# Patient Record
Sex: Female | Born: 1945
Health system: Southern US, Community
[De-identification: ages and names within clinical notes are randomized; demographics above are authoritative.]

## PROBLEM LIST (undated history)

## (undated) DIAGNOSIS — Z8619 Personal history of other infectious and parasitic diseases: Secondary | ICD-10-CM

## (undated) DIAGNOSIS — R430 Anosmia: Secondary | ICD-10-CM

## (undated) DIAGNOSIS — M810 Age-related osteoporosis without current pathological fracture: Secondary | ICD-10-CM

## (undated) DIAGNOSIS — M419 Scoliosis, unspecified: Secondary | ICD-10-CM

## (undated) DIAGNOSIS — N939 Abnormal uterine and vaginal bleeding, unspecified: Secondary | ICD-10-CM

## (undated) DIAGNOSIS — R432 Parageusia: Secondary | ICD-10-CM

## (undated) HISTORY — DX: Parageusia: R43.2

## (undated) HISTORY — DX: Personal history of other infectious and parasitic diseases: Z86.19

## (undated) HISTORY — DX: Scoliosis, unspecified: M41.9

## (undated) HISTORY — DX: Anosmia: R43.0

## (undated) HISTORY — DX: Abnormal uterine and vaginal bleeding, unspecified: N93.9

## (undated) HISTORY — DX: Age-related osteoporosis without current pathological fracture: M81.0

---

## 1996-07-12 DIAGNOSIS — N939 Abnormal uterine and vaginal bleeding, unspecified: Secondary | ICD-10-CM

## 1996-07-12 HISTORY — DX: Abnormal uterine and vaginal bleeding, unspecified: N93.9

## 1999-10-08 ENCOUNTER — Other Ambulatory Visit: Admission: RE | Admit: 1999-10-08 | Discharge: 1999-10-08 | Payer: Self-pay | Admitting: *Deleted

## 2000-11-01 ENCOUNTER — Other Ambulatory Visit: Admission: RE | Admit: 2000-11-01 | Discharge: 2000-11-01 | Payer: Self-pay | Admitting: Obstetrics and Gynecology

## 2003-04-09 ENCOUNTER — Other Ambulatory Visit: Admission: RE | Admit: 2003-04-09 | Discharge: 2003-04-09 | Payer: Self-pay | Admitting: Obstetrics and Gynecology

## 2004-05-12 ENCOUNTER — Other Ambulatory Visit: Admission: RE | Admit: 2004-05-12 | Discharge: 2004-05-12 | Payer: Self-pay | Admitting: Obstetrics and Gynecology

## 2005-12-10 ENCOUNTER — Other Ambulatory Visit: Admission: RE | Admit: 2005-12-10 | Discharge: 2005-12-10 | Payer: Self-pay | Admitting: Obstetrics and Gynecology

## 2007-03-15 ENCOUNTER — Other Ambulatory Visit: Admission: RE | Admit: 2007-03-15 | Discharge: 2007-03-15 | Payer: Self-pay | Admitting: Obstetrics and Gynecology

## 2008-03-01 ENCOUNTER — Other Ambulatory Visit: Admission: RE | Admit: 2008-03-01 | Discharge: 2008-03-01 | Payer: Self-pay | Admitting: Obstetrics and Gynecology

## 2010-05-07 LAB — HM DEXA SCAN

## 2010-11-03 ENCOUNTER — Other Ambulatory Visit: Payer: Self-pay | Admitting: Allergy

## 2010-11-03 ENCOUNTER — Ambulatory Visit
Admission: RE | Admit: 2010-11-03 | Discharge: 2010-11-03 | Disposition: A | Discharge status: Home / Self Care | Payer: BC Managed Care – PPO | Source: Ambulatory Visit | Attending: Allergy | Admitting: Allergy

## 2011-01-11 ENCOUNTER — Other Ambulatory Visit: Payer: Self-pay | Admitting: Otolaryngology

## 2011-01-11 DIAGNOSIS — R439 Unspecified disturbances of smell and taste: Secondary | ICD-10-CM

## 2011-01-12 ENCOUNTER — Ambulatory Visit
Admission: RE | Admit: 2011-01-12 | Discharge: 2011-01-12 | Disposition: A | Discharge status: Home / Self Care | Payer: BC Managed Care – PPO | Source: Ambulatory Visit | Attending: Otolaryngology | Admitting: Otolaryngology

## 2011-01-12 DIAGNOSIS — R439 Unspecified disturbances of smell and taste: Secondary | ICD-10-CM

## 2011-01-12 MED ORDER — GADOBENATE DIMEGLUMINE 529 MG/ML IV SOLN
11.0000 mL | Freq: Once | INTRAVENOUS | Status: AC | PRN
  Administered 2011-01-12: 11 mL via INTRAVENOUS

## 2011-07-13 HISTORY — PX: NASAL SINUS SURGERY: SHX719

## 2012-04-12 DIAGNOSIS — R030 Elevated blood-pressure reading, without diagnosis of hypertension: Secondary | ICD-10-CM | POA: Diagnosis not present

## 2012-04-12 DIAGNOSIS — R82998 Other abnormal findings in urine: Secondary | ICD-10-CM | POA: Diagnosis not present

## 2012-04-12 DIAGNOSIS — M4120 Other idiopathic scoliosis, site unspecified: Secondary | ICD-10-CM | POA: Diagnosis not present

## 2012-04-19 DIAGNOSIS — Z0000 Encounter for general adult medical examination without abnormal findings: Secondary | ICD-10-CM | POA: Diagnosis not present

## 2012-04-19 DIAGNOSIS — J45909 Unspecified asthma, uncomplicated: Secondary | ICD-10-CM | POA: Diagnosis not present

## 2012-04-19 DIAGNOSIS — R030 Elevated blood-pressure reading, without diagnosis of hypertension: Secondary | ICD-10-CM | POA: Diagnosis not present

## 2012-04-19 DIAGNOSIS — M4120 Other idiopathic scoliosis, site unspecified: Secondary | ICD-10-CM | POA: Diagnosis not present

## 2012-04-20 DIAGNOSIS — Z1212 Encounter for screening for malignant neoplasm of rectum: Secondary | ICD-10-CM | POA: Diagnosis not present

## 2012-04-24 DIAGNOSIS — H4011X0 Primary open-angle glaucoma, stage unspecified: Secondary | ICD-10-CM | POA: Diagnosis not present

## 2012-04-24 DIAGNOSIS — H409 Unspecified glaucoma: Secondary | ICD-10-CM | POA: Diagnosis not present

## 2012-07-10 DIAGNOSIS — J322 Chronic ethmoidal sinusitis: Secondary | ICD-10-CM | POA: Diagnosis not present

## 2012-07-10 DIAGNOSIS — J320 Chronic maxillary sinusitis: Secondary | ICD-10-CM | POA: Diagnosis not present

## 2012-07-10 DIAGNOSIS — J321 Chronic frontal sinusitis: Secondary | ICD-10-CM | POA: Diagnosis not present

## 2012-09-08 DIAGNOSIS — H1130 Conjunctival hemorrhage, unspecified eye: Secondary | ICD-10-CM | POA: Diagnosis not present

## 2012-09-21 DIAGNOSIS — Z1231 Encounter for screening mammogram for malignant neoplasm of breast: Secondary | ICD-10-CM | POA: Diagnosis not present

## 2012-10-26 DIAGNOSIS — H409 Unspecified glaucoma: Secondary | ICD-10-CM | POA: Diagnosis not present

## 2012-10-26 DIAGNOSIS — H4011X0 Primary open-angle glaucoma, stage unspecified: Secondary | ICD-10-CM | POA: Diagnosis not present

## 2012-10-30 DIAGNOSIS — H43819 Vitreous degeneration, unspecified eye: Secondary | ICD-10-CM | POA: Diagnosis not present

## 2012-12-12 DIAGNOSIS — J320 Chronic maxillary sinusitis: Secondary | ICD-10-CM | POA: Diagnosis not present

## 2012-12-12 DIAGNOSIS — J322 Chronic ethmoidal sinusitis: Secondary | ICD-10-CM | POA: Diagnosis not present

## 2012-12-12 DIAGNOSIS — J321 Chronic frontal sinusitis: Secondary | ICD-10-CM | POA: Diagnosis not present

## 2012-12-14 DIAGNOSIS — H4011X0 Primary open-angle glaucoma, stage unspecified: Secondary | ICD-10-CM | POA: Diagnosis not present

## 2012-12-14 DIAGNOSIS — H409 Unspecified glaucoma: Secondary | ICD-10-CM | POA: Diagnosis not present

## 2012-12-14 DIAGNOSIS — H43819 Vitreous degeneration, unspecified eye: Secondary | ICD-10-CM | POA: Diagnosis not present

## 2013-02-01 DIAGNOSIS — J45909 Unspecified asthma, uncomplicated: Secondary | ICD-10-CM | POA: Diagnosis not present

## 2013-02-01 DIAGNOSIS — K219 Gastro-esophageal reflux disease without esophagitis: Secondary | ICD-10-CM | POA: Diagnosis not present

## 2013-04-09 ENCOUNTER — Encounter: Payer: Self-pay | Admitting: Nurse Practitioner

## 2013-04-09 ENCOUNTER — Ambulatory Visit (INDEPENDENT_AMBULATORY_CARE_PROVIDER_SITE_OTHER): Payer: Medicare Other | Admitting: Nurse Practitioner

## 2013-04-09 VITALS — BP 132/76 | HR 84 | Resp 16 | Ht 61.5 in | Wt 133.0 lb

## 2013-04-09 DIAGNOSIS — Z01419 Encounter for gynecological examination (general) (routine) without abnormal findings: Secondary | ICD-10-CM | POA: Diagnosis not present

## 2013-04-09 NOTE — Patient Instructions (Addendum)

## 2013-04-09 NOTE — Progress Notes (Signed)
Patient ID: Kerry White, female   DOB: Aug 26, 1945, 67 y.o.   MRN: [id number] 67 y.o. G36P1001 Married Caucasian Fe here for annual exam.  She has no current health concerns.  She gets her mammogram done in [place]. and will have results sent here.  She has vaginal dryness and occassional hot flashes.  No LMP recorded. Patient is postmenopausal.          Sexually active: yes  The current method of family planning is post menopausal status.    Exercising: yes  Home exercise routine includes walking. Smoker:  no  Health Maintenance: Pap:  03/11/10, WNL MMG:  08/26/11, BI-Rads 2: benign ( done 09/2012 will send a copy) Colonoscopy:  never BMD:   05/07/10, osteoporosis TDaP:  > 10 years Shingles: not done Labs: declined, PCP apt next week   reports that she has never smoked. She has never used smokeless tobacco. She reports that  drinks alcohol. She reports that she does not use illicit drugs.  Past Medical History  Diagnosis Date  . Abnormal uterine bleeding 1998    Past Surgical History  Procedure Laterality Date  . Nasal sinus surgery  2013    Current Outpatient Prescriptions  Medication Sig Dispense Refill  . ALVESCO 80 MCG/ACT inhaler Inhale 1 puff into the lungs 2 (two) times daily.      . BUDESONIDE NA Place into the nose daily.       No current facility-administered medications for this visit.    Family History  Problem Relation Age of Onset  . Alzheimer's disease Mother   . Heart failure Father   . Hyperlipidemia Father   . Heart disease Brother   . Glaucoma Brother     ROS:  Pertinent items are noted in HPI.  Otherwise, a comprehensive ROS was negative.  Exam:   BP 132/76  Pulse 84  Resp 16  Ht 5' 1.5" (1.562 m)  Wt 133 lb (60.328 kg)  BMI 24.73 kg/m2 Height: 5' 1.5" (156.2 cm)  Ht Readings from Last 3 Encounters:  04/09/13 5' 1.5" (1.562 m)    General appearance: alert, cooperative and appears stated age Head: Normocephalic, without obvious  abnormality, atraumatic Neck: no adenopathy, supple, symmetrical, trachea midline and thyroid normal to inspection and palpation Lungs: clear to auscultation bilaterally Breasts: normal appearance, no masses or tenderness Heart: regular rate and rhythm Abdomen: soft, non-tender; no masses,  no organomegaly Extremities: extremities normal, atraumatic, no cyanosis or edema Skin: Skin color, texture, turgor normal. No rashes or lesions Lymph nodes: Cervical, supraclavicular, and axillary nodes normal. No abnormal inguinal nodes palpated Neurologic: Grossly normal Spine - scoliosis is noted   Pelvic: External genitalia:  no lesions              Urethra:  Atrophic and prolapse appearing urethra with no masses, tenderness or lesions              Bartholin's and Skene's: normal                 Vagina: atrophic appearing vagina with pale color and no discharge, no lesions, grade I cystocele              Cervix: anteverted              Pap taken: no Bimanual Exam:  Uterus:  normal size, contour, position, consistency, mobility, non-tender              Adnexa: no mass, fullness, tenderness  Rectovaginal: Confirms               Anus:  normal sphincter tone, no lesions  A:  Well Woman with normal exam  Postmenopausal no HRT  Scoliosis and osteoporosis - followed by PCP    P:   Pap smear as per guidelines Not done today  Mammogram pt will send copy of last one done  Advise extra virgin olive oil of vaginal dryness  She will follow with PCP about update on Immunizations.  Counseled on breast self exam, adequate intake of calcium and vitamin D, diet and exercise return annually or prn  An After Visit Summary was printed and given to the patient.

## 2013-04-10 NOTE — Progress Notes (Signed)
Reviewed personally.  M. Suzanne Pankaj Haack, MD.  

## 2013-04-16 DIAGNOSIS — J321 Chronic frontal sinusitis: Secondary | ICD-10-CM | POA: Diagnosis not present

## 2013-04-16 DIAGNOSIS — J322 Chronic ethmoidal sinusitis: Secondary | ICD-10-CM | POA: Diagnosis not present

## 2013-04-16 DIAGNOSIS — H6120 Impacted cerumen, unspecified ear: Secondary | ICD-10-CM | POA: Diagnosis not present

## 2013-04-16 DIAGNOSIS — Z0000 Encounter for general adult medical examination without abnormal findings: Secondary | ICD-10-CM | POA: Diagnosis not present

## 2013-04-16 DIAGNOSIS — R82998 Other abnormal findings in urine: Secondary | ICD-10-CM | POA: Diagnosis not present

## 2013-04-16 DIAGNOSIS — J320 Chronic maxillary sinusitis: Secondary | ICD-10-CM | POA: Diagnosis not present

## 2013-04-23 DIAGNOSIS — Z6825 Body mass index (BMI) 25.0-25.9, adult: Secondary | ICD-10-CM | POA: Diagnosis not present

## 2013-04-23 DIAGNOSIS — Z23 Encounter for immunization: Secondary | ICD-10-CM | POA: Diagnosis not present

## 2013-04-23 DIAGNOSIS — Z1212 Encounter for screening for malignant neoplasm of rectum: Secondary | ICD-10-CM | POA: Diagnosis not present

## 2013-04-23 DIAGNOSIS — Z0000 Encounter for general adult medical examination without abnormal findings: Secondary | ICD-10-CM | POA: Diagnosis not present

## 2013-04-23 DIAGNOSIS — R030 Elevated blood-pressure reading, without diagnosis of hypertension: Secondary | ICD-10-CM | POA: Diagnosis not present

## 2013-04-23 DIAGNOSIS — J301 Allergic rhinitis due to pollen: Secondary | ICD-10-CM | POA: Diagnosis not present

## 2013-04-23 DIAGNOSIS — J45909 Unspecified asthma, uncomplicated: Secondary | ICD-10-CM | POA: Diagnosis not present

## 2013-04-23 DIAGNOSIS — Z1331 Encounter for screening for depression: Secondary | ICD-10-CM | POA: Diagnosis not present

## 2013-05-02 DIAGNOSIS — H409 Unspecified glaucoma: Secondary | ICD-10-CM | POA: Diagnosis not present

## 2013-05-02 DIAGNOSIS — H4011X0 Primary open-angle glaucoma, stage unspecified: Secondary | ICD-10-CM | POA: Diagnosis not present

## 2013-05-15 DIAGNOSIS — H01009 Unspecified blepharitis unspecified eye, unspecified eyelid: Secondary | ICD-10-CM | POA: Diagnosis not present

## 2013-08-22 DIAGNOSIS — H40019 Open angle with borderline findings, low risk, unspecified eye: Secondary | ICD-10-CM | POA: Diagnosis not present

## 2013-08-22 DIAGNOSIS — H2510 Age-related nuclear cataract, unspecified eye: Secondary | ICD-10-CM | POA: Diagnosis not present

## 2013-08-22 DIAGNOSIS — H43399 Other vitreous opacities, unspecified eye: Secondary | ICD-10-CM | POA: Diagnosis not present

## 2013-08-22 DIAGNOSIS — H01009 Unspecified blepharitis unspecified eye, unspecified eyelid: Secondary | ICD-10-CM | POA: Diagnosis not present

## 2013-10-15 DIAGNOSIS — J322 Chronic ethmoidal sinusitis: Secondary | ICD-10-CM | POA: Diagnosis not present

## 2013-10-15 DIAGNOSIS — T7840XA Allergy, unspecified, initial encounter: Secondary | ICD-10-CM | POA: Diagnosis not present

## 2013-10-15 DIAGNOSIS — J45909 Unspecified asthma, uncomplicated: Secondary | ICD-10-CM | POA: Diagnosis not present

## 2013-11-22 ENCOUNTER — Encounter: Payer: Self-pay | Admitting: Nurse Practitioner

## 2013-12-04 DIAGNOSIS — H01009 Unspecified blepharitis unspecified eye, unspecified eyelid: Secondary | ICD-10-CM | POA: Diagnosis not present

## 2013-12-04 DIAGNOSIS — H40019 Open angle with borderline findings, low risk, unspecified eye: Secondary | ICD-10-CM | POA: Diagnosis not present

## 2013-12-04 DIAGNOSIS — H2510 Age-related nuclear cataract, unspecified eye: Secondary | ICD-10-CM | POA: Diagnosis not present

## 2013-12-04 DIAGNOSIS — H04129 Dry eye syndrome of unspecified lacrimal gland: Secondary | ICD-10-CM | POA: Diagnosis not present

## 2014-04-11 ENCOUNTER — Ambulatory Visit: Payer: Medicare Other | Admitting: Nurse Practitioner

## 2014-04-15 ENCOUNTER — Ambulatory Visit: Payer: Medicare Other | Admitting: Nurse Practitioner

## 2014-04-15 DIAGNOSIS — H01002 Unspecified blepharitis right lower eyelid: Secondary | ICD-10-CM | POA: Diagnosis not present

## 2014-04-15 DIAGNOSIS — H04123 Dry eye syndrome of bilateral lacrimal glands: Secondary | ICD-10-CM | POA: Diagnosis not present

## 2014-04-15 DIAGNOSIS — H2513 Age-related nuclear cataract, bilateral: Secondary | ICD-10-CM | POA: Diagnosis not present

## 2014-04-15 DIAGNOSIS — J322 Chronic ethmoidal sinusitis: Secondary | ICD-10-CM | POA: Diagnosis not present

## 2014-04-15 DIAGNOSIS — H40013 Open angle with borderline findings, low risk, bilateral: Secondary | ICD-10-CM | POA: Diagnosis not present

## 2014-04-15 DIAGNOSIS — H01005 Unspecified blepharitis left lower eyelid: Secondary | ICD-10-CM | POA: Diagnosis not present

## 2014-04-15 DIAGNOSIS — J329 Chronic sinusitis, unspecified: Secondary | ICD-10-CM | POA: Diagnosis not present

## 2014-04-15 DIAGNOSIS — G4489 Other headache syndrome: Secondary | ICD-10-CM | POA: Diagnosis not present

## 2014-04-15 DIAGNOSIS — J4530 Mild persistent asthma, uncomplicated: Secondary | ICD-10-CM | POA: Diagnosis not present

## 2014-04-15 DIAGNOSIS — J321 Chronic frontal sinusitis: Secondary | ICD-10-CM | POA: Diagnosis not present

## 2014-04-16 ENCOUNTER — Ambulatory Visit: Payer: Medicare Other | Admitting: Nurse Practitioner

## 2014-04-18 DIAGNOSIS — R030 Elevated blood-pressure reading, without diagnosis of hypertension: Secondary | ICD-10-CM | POA: Diagnosis not present

## 2014-04-18 DIAGNOSIS — R8299 Other abnormal findings in urine: Secondary | ICD-10-CM | POA: Diagnosis not present

## 2014-04-23 ENCOUNTER — Ambulatory Visit (INDEPENDENT_AMBULATORY_CARE_PROVIDER_SITE_OTHER): Payer: Medicare Other | Admitting: Nurse Practitioner

## 2014-04-23 ENCOUNTER — Encounter (INDEPENDENT_AMBULATORY_CARE_PROVIDER_SITE_OTHER): Payer: Self-pay

## 2014-04-23 ENCOUNTER — Encounter: Payer: Self-pay | Admitting: Nurse Practitioner

## 2014-04-23 VITALS — BP 144/92 | HR 60 | Ht 61.5 in | Wt 139.0 lb

## 2014-04-23 DIAGNOSIS — Z01419 Encounter for gynecological examination (general) (routine) without abnormal findings: Secondary | ICD-10-CM

## 2014-04-23 DIAGNOSIS — M810 Age-related osteoporosis without current pathological fracture: Secondary | ICD-10-CM | POA: Diagnosis not present

## 2014-04-23 NOTE — Patient Instructions (Addendum)

## 2014-04-23 NOTE — Progress Notes (Signed)
68 y.o. G64P1001 Married Caucasian Fe here for annual exam.  Normal BP at home - always had white coat HTN..  No new health problems.    Patient's last menstrual period was 07/12/2000.          Sexually active: No.  The current method of family planning is post menopausal status.    Exercising: Yes.    Home exercise routine includes walking 2 to 3 times a week. Smoker:  no  Health Maintenance: Pap:  03/11/10, WNL MMG:  08/26/11, Bi-Rads benign will schedule - she normally gets done in [place] and now will schedule in [place] Colonoscopy:  Never - declines BMD:   05/07/10, osteoporosis will schedule with PCP tomorrow TDaP:  Unsure - check with PCP Labs: declined, PCP apt tomorrow   reports that she has never smoked. She has never used smokeless tobacco. She reports that she drinks alcohol. She reports that she does not use illicit drugs.  Past Medical History  Diagnosis Date  . Abnormal uterine bleeding 1998    Past Surgical History  Procedure Laterality Date  . Nasal sinus surgery  2013    Current Outpatient Prescriptions  Medication Sig Dispense Refill  . ALVESCO 80 MCG/ACT inhaler Inhale 1 puff into the lungs daily.       . BUDESONIDE NA Place into the nose daily.       No current facility-administered medications for this visit.    Family History  Problem Relation Age of Onset  . Alzheimer's disease Mother   . Heart failure Father   . Hyperlipidemia Father   . Heart disease Brother   . Glaucoma Brother     ROS:  Pertinent items are noted in HPI.  Otherwise, a comprehensive ROS was negative.  Exam:   BP 144/92  Pulse 60  Ht 5' 1.5" (1.562 m)  Wt 139 lb (63.05 kg)  BMI 25.84 kg/m2  LMP 07/12/2000 Height: 5' 1.5" (156.2 cm)  Ht Readings from Last 3 Encounters:  04/23/14 5' 1.5" (1.562 m)  04/09/13 5' 1.5" (1.562 m)    General appearance: alert, cooperative and appears stated age Head: Normocephalic, without obvious abnormality, atraumatic Neck: no  adenopathy, supple, symmetrical, trachea midline and thyroid normal to inspection and palpation Lungs: clear to auscultation bilaterally, scoliosis of thoracic spine Breasts: normal appearance, no masses or tenderness Heart: regular rate and rhythm Abdomen: soft, non-tender; no masses,  no organomegaly Extremities: extremities normal, atraumatic, no cyanosis or edema Skin: Skin color, texture, turgor normal. No rashes or lesions Lymph nodes: Cervical, supraclavicular, and axillary nodes normal. No abnormal inguinal nodes palpated Neurologic: Grossly normal   Pelvic: External genitalia:  no lesions              Urethra:  normal appearing urethra with no masses, tenderness or lesions              Bartholin's and Skene's: normal                 Vagina: normal appearing vagina with normal color and discharge, no lesions              Cervix: anteverted              Pap taken: No. Bimanual Exam:  Uterus:  normal size, contour, position, consistency, mobility, non-tender              Adnexa: no mass, fullness, tenderness               Rectovaginal:  Confirms               Anus:  normal sphincter tone, no lesions  A:  Well Woman with normal exam  Postmenopausal no HRT   Scoliosis and osteoporosis - followed by PCP  P:   Reviewed health and wellness pertinent to exam  Pap smear not taken today  Mammogram is due now and will schedule  Counseled on breast self exam, mammography screening, adequate intake of calcium and vitamin D, diet and exercise, Kegel's exercises return annually or prn  An After Visit Summary was printed and given to the patient.

## 2014-04-24 DIAGNOSIS — M810 Age-related osteoporosis without current pathological fracture: Secondary | ICD-10-CM | POA: Diagnosis not present

## 2014-04-24 NOTE — Progress Notes (Signed)
Encounter reviewed by Dr. Ligia Duguay Silva.  

## 2014-04-25 ENCOUNTER — Ambulatory Visit: Payer: Medicare Other | Admitting: Nurse Practitioner

## 2014-04-25 DIAGNOSIS — Z1389 Encounter for screening for other disorder: Secondary | ICD-10-CM | POA: Diagnosis not present

## 2014-04-25 DIAGNOSIS — J302 Other seasonal allergic rhinitis: Secondary | ICD-10-CM | POA: Diagnosis not present

## 2014-04-25 DIAGNOSIS — Z1212 Encounter for screening for malignant neoplasm of rectum: Secondary | ICD-10-CM | POA: Diagnosis not present

## 2014-04-25 DIAGNOSIS — Z23 Encounter for immunization: Secondary | ICD-10-CM | POA: Diagnosis not present

## 2014-04-25 DIAGNOSIS — J45909 Unspecified asthma, uncomplicated: Secondary | ICD-10-CM | POA: Diagnosis not present

## 2014-04-25 DIAGNOSIS — M810 Age-related osteoporosis without current pathological fracture: Secondary | ICD-10-CM | POA: Diagnosis not present

## 2014-04-25 DIAGNOSIS — Z0000 Encounter for general adult medical examination without abnormal findings: Secondary | ICD-10-CM | POA: Diagnosis not present

## 2014-04-25 DIAGNOSIS — Z6826 Body mass index (BMI) 26.0-26.9, adult: Secondary | ICD-10-CM | POA: Diagnosis not present

## 2014-05-13 ENCOUNTER — Encounter: Payer: Self-pay | Admitting: Nurse Practitioner

## 2014-08-23 DIAGNOSIS — H01001 Unspecified blepharitis right upper eyelid: Secondary | ICD-10-CM | POA: Diagnosis not present

## 2014-08-23 DIAGNOSIS — H01005 Unspecified blepharitis left lower eyelid: Secondary | ICD-10-CM | POA: Diagnosis not present

## 2014-08-23 DIAGNOSIS — H2513 Age-related nuclear cataract, bilateral: Secondary | ICD-10-CM | POA: Diagnosis not present

## 2014-08-23 DIAGNOSIS — H01002 Unspecified blepharitis right lower eyelid: Secondary | ICD-10-CM | POA: Diagnosis not present

## 2014-08-23 DIAGNOSIS — H04123 Dry eye syndrome of bilateral lacrimal glands: Secondary | ICD-10-CM | POA: Diagnosis not present

## 2014-08-23 DIAGNOSIS — H01004 Unspecified blepharitis left upper eyelid: Secondary | ICD-10-CM | POA: Diagnosis not present

## 2014-08-23 DIAGNOSIS — H40013 Open angle with borderline findings, low risk, bilateral: Secondary | ICD-10-CM | POA: Diagnosis not present

## 2014-08-23 DIAGNOSIS — Z1231 Encounter for screening mammogram for malignant neoplasm of breast: Secondary | ICD-10-CM | POA: Diagnosis not present

## 2014-09-11 DIAGNOSIS — R300 Dysuria: Secondary | ICD-10-CM | POA: Diagnosis not present

## 2014-09-11 DIAGNOSIS — N390 Urinary tract infection, site not specified: Secondary | ICD-10-CM | POA: Diagnosis not present

## 2014-10-23 ENCOUNTER — Telehealth: Payer: Self-pay | Admitting: Nurse Practitioner

## 2014-10-23 ENCOUNTER — Ambulatory Visit (INDEPENDENT_AMBULATORY_CARE_PROVIDER_SITE_OTHER): Payer: Medicare Other | Admitting: Obstetrics and Gynecology

## 2014-10-23 ENCOUNTER — Encounter: Payer: Self-pay | Admitting: Obstetrics and Gynecology

## 2014-10-23 VITALS — BP 136/82 | HR 90 | Ht 61.5 in | Wt 136.4 lb

## 2014-10-23 DIAGNOSIS — I781 Nevus, non-neoplastic: Secondary | ICD-10-CM

## 2014-10-23 DIAGNOSIS — N8110 Cystocele, unspecified: Secondary | ICD-10-CM

## 2014-10-23 DIAGNOSIS — D1801 Hemangioma of skin and subcutaneous tissue: Secondary | ICD-10-CM

## 2014-10-23 NOTE — Telephone Encounter (Signed)
Return call to patient. She states she has history of "cherry mole" in past and had three of these. This am has discovered lump inside vagina that is painful and small amount of bleeding. Patient anxious and request office visit this aftetrnoon for "peace of mind." advised if she is able to come now and is willing to wait for work-in office visit. Patient agreeable and very appreciative.   Routing to provider for final review. Patient agreeable to disposition. Will close encounter

## 2014-10-23 NOTE — Telephone Encounter (Signed)
Patient has a "cherry mole" on her vagina. Patient says it has been itching and bleeding" Last seen 04/25/14.

## 2014-10-23 NOTE — Progress Notes (Signed)
Patient ID: Kerry White, female   DOB: 1946/04/26, 69 y.o.   MRN: [id number] GYNECOLOGY VISIT  PCP:  Bevelyn Buckles, MD  Referring provider:   HPI: 69 y.o.   Married  Caucasian  female   G1P1001 with Patient's last menstrual period was 07/12/2000.   here for evaluation of vaginal hemangioma.  (Has had several removed.) Has several hamangiomas on her vulva.  Patient felt something different.  Itching.  Then noticed a small amount of blood.  Has prolapse of bladder.  Does not use a pessary.   Not using any products in the vagina.  Sexually active off and on.   Status post treatment for UTI.  GYNECOLOGIC HISTORY: Patient's last menstrual period was 07/12/2000. Sexually active: yes Partner preference: female Contraception: postmenopausal   Menopausal hormone therapy: none DES exposure:    Blood transfusions:   no Sexually transmitted diseases:  no  GYN procedures and prior surgeries:  No Last mammogram:   07/2014 normal:[facility] in [place]            Last pap and high risk HPV testing:  03-11-10 wnl  History of abnormal pap smear:  no   OB History    Gravida Para Term Preterm AB TAB SAB Ectopic Multiple Living   1 1 1       1        Past Medical History  Diagnosis Date  . Abnormal uterine bleeding 1998    Past Surgical History  Procedure Laterality Date  . Nasal sinus surgery  2013    Current Outpatient Prescriptions  Medication Sig Dispense Refill  . ALVESCO 80 MCG/ACT inhaler Inhale 1 puff into the lungs daily.     . BUDESONIDE NA Place into the nose daily.    . Vitamin D, Ergocalciferol, (DRISDOL) 50000 UNITS CAPS capsule Take 50,000 Units by mouth every 30 (thirty) days.   4   No current facility-administered medications for this visit.     ALLERGIES: Penicillins and Sulfa antibiotics  Family History  Problem Relation Age of Onset  . Alzheimer's disease Mother   . Heart failure Father   . Hyperlipidemia Father   . Heart disease Brother   .  Glaucoma Brother     History   Social History  . Marital Status: Married    Spouse Name: N/A  . Number of Children: 1  . Years of Education: N/A   Occupational History  . Not on file.   Social History Main Topics  . Smoking status: Never Smoker   . Smokeless tobacco: Never Used  . Alcohol Use: 0.0 oz/week    0 Standard drinks or equivalent per week     Comment: social  . Drug Use: No  . Sexual Activity:    Partners: Male    Birth Control/ Protection: Post-menopausal, Surgical     Comment: vasectomy   Other Topics Concern  . Not on file   Social History Narrative    ROS:  Pertinent items are noted in HPI.  PHYSICAL EXAMINATION:    BP 136/82 mmHg  Pulse 90  Ht 5' 1.5" (1.562 m)  Wt 136 lb 6.4 oz (61.871 kg)  BMI 25.36 kg/m2  LMP 07/12/2000   Wt Readings from Last 3 Encounters:  10/23/14 136 lb 6.4 oz (61.871 kg)  04/23/14 139 lb (63.05 kg)  04/09/13 133 lb (60.328 kg)     Ht Readings from Last 3 Encounters:  10/23/14 5' 1.5" (1.562 m)  04/23/14 5' 1.5" (1.562 m)  04/09/13 5'  1.5" (1.562 m)    General appearance: alert, cooperative and appears stated age   Pelvic: External genitalia:   Multiple cherry hemangioma.              Urethra:  normal appearing urethra with no masses, tenderness or lesions              Bartholins and Skenes: normal                 Vagina: normal appearing vagina with normal color and discharge, no lesions, cystocele.              Cervix: normal appearance.  No blood noted.                  Bimanual Exam:  Uterus:  uterus is normal size, shape, consistency and nontender                                      Adnexa: normal adnexa in size, nontender and no masses                                      ASSESSMENT  Multiple cherry hemangioma of the vulva. No vaginal hemangiomas.  Bladder prolapse.   PLAN  Extensive discussion of hemangiomas of the vulvar and vaginal region.  Reassurance given.  No need for removal.  No  increased risk of cancer.  Can use water based lubricants to hydrate the skin and reduce itching.  Avoid external irritants. Discussion of bladder prolapse.   An After Visit Summary was printed and given to the patient.  ___25____ minutes face to face time of which over 50% was spent in counseling.

## 2014-10-31 ENCOUNTER — Ambulatory Visit: Payer: Medicare Other | Admitting: Obstetrics and Gynecology

## 2014-12-31 DIAGNOSIS — H2513 Age-related nuclear cataract, bilateral: Secondary | ICD-10-CM | POA: Diagnosis not present

## 2014-12-31 DIAGNOSIS — H01002 Unspecified blepharitis right lower eyelid: Secondary | ICD-10-CM | POA: Diagnosis not present

## 2014-12-31 DIAGNOSIS — H04123 Dry eye syndrome of bilateral lacrimal glands: Secondary | ICD-10-CM | POA: Diagnosis not present

## 2014-12-31 DIAGNOSIS — H01005 Unspecified blepharitis left lower eyelid: Secondary | ICD-10-CM | POA: Diagnosis not present

## 2014-12-31 DIAGNOSIS — H01004 Unspecified blepharitis left upper eyelid: Secondary | ICD-10-CM | POA: Diagnosis not present

## 2014-12-31 DIAGNOSIS — H401331 Pigmentary glaucoma, bilateral, mild stage: Secondary | ICD-10-CM | POA: Diagnosis not present

## 2014-12-31 DIAGNOSIS — H01001 Unspecified blepharitis right upper eyelid: Secondary | ICD-10-CM | POA: Diagnosis not present

## 2015-01-24 DIAGNOSIS — N390 Urinary tract infection, site not specified: Secondary | ICD-10-CM | POA: Diagnosis not present

## 2015-01-24 DIAGNOSIS — R8299 Other abnormal findings in urine: Secondary | ICD-10-CM | POA: Diagnosis not present

## 2015-02-17 DIAGNOSIS — R51 Headache: Secondary | ICD-10-CM | POA: Diagnosis not present

## 2015-02-17 DIAGNOSIS — J4520 Mild intermittent asthma, uncomplicated: Secondary | ICD-10-CM | POA: Diagnosis not present

## 2015-04-15 DIAGNOSIS — J321 Chronic frontal sinusitis: Secondary | ICD-10-CM | POA: Diagnosis not present

## 2015-04-15 DIAGNOSIS — H04123 Dry eye syndrome of bilateral lacrimal glands: Secondary | ICD-10-CM | POA: Diagnosis not present

## 2015-04-15 DIAGNOSIS — H524 Presbyopia: Secondary | ICD-10-CM | POA: Diagnosis not present

## 2015-04-15 DIAGNOSIS — H2513 Age-related nuclear cataract, bilateral: Secondary | ICD-10-CM | POA: Diagnosis not present

## 2015-04-15 DIAGNOSIS — H401331 Pigmentary glaucoma, bilateral, mild stage: Secondary | ICD-10-CM | POA: Diagnosis not present

## 2015-04-15 DIAGNOSIS — H01001 Unspecified blepharitis right upper eyelid: Secondary | ICD-10-CM | POA: Diagnosis not present

## 2015-04-15 DIAGNOSIS — J310 Chronic rhinitis: Secondary | ICD-10-CM | POA: Diagnosis not present

## 2015-04-15 DIAGNOSIS — H01002 Unspecified blepharitis right lower eyelid: Secondary | ICD-10-CM | POA: Diagnosis not present

## 2015-04-15 DIAGNOSIS — H01004 Unspecified blepharitis left upper eyelid: Secondary | ICD-10-CM | POA: Diagnosis not present

## 2015-04-15 DIAGNOSIS — H01005 Unspecified blepharitis left lower eyelid: Secondary | ICD-10-CM | POA: Diagnosis not present

## 2015-04-28 DIAGNOSIS — Z23 Encounter for immunization: Secondary | ICD-10-CM | POA: Diagnosis not present

## 2015-04-28 DIAGNOSIS — Z0000 Encounter for general adult medical examination without abnormal findings: Secondary | ICD-10-CM | POA: Diagnosis not present

## 2015-04-28 DIAGNOSIS — R8290 Unspecified abnormal findings in urine: Secondary | ICD-10-CM | POA: Diagnosis not present

## 2015-04-28 DIAGNOSIS — M810 Age-related osteoporosis without current pathological fracture: Secondary | ICD-10-CM | POA: Diagnosis not present

## 2015-04-28 DIAGNOSIS — N390 Urinary tract infection, site not specified: Secondary | ICD-10-CM | POA: Diagnosis not present

## 2015-04-29 ENCOUNTER — Ambulatory Visit: Payer: Medicare Other | Admitting: Nurse Practitioner

## 2015-05-13 DIAGNOSIS — J984 Other disorders of lung: Secondary | ICD-10-CM | POA: Diagnosis not present

## 2015-05-13 DIAGNOSIS — Z6825 Body mass index (BMI) 25.0-25.9, adult: Secondary | ICD-10-CM | POA: Diagnosis not present

## 2015-05-13 DIAGNOSIS — M419 Scoliosis, unspecified: Secondary | ICD-10-CM | POA: Diagnosis not present

## 2015-05-13 DIAGNOSIS — Z1389 Encounter for screening for other disorder: Secondary | ICD-10-CM | POA: Diagnosis not present

## 2015-05-13 DIAGNOSIS — J45909 Unspecified asthma, uncomplicated: Secondary | ICD-10-CM | POA: Diagnosis not present

## 2015-05-13 DIAGNOSIS — R030 Elevated blood-pressure reading, without diagnosis of hypertension: Secondary | ICD-10-CM | POA: Diagnosis not present

## 2015-05-13 DIAGNOSIS — M810 Age-related osteoporosis without current pathological fracture: Secondary | ICD-10-CM | POA: Diagnosis not present

## 2015-05-13 DIAGNOSIS — Z0000 Encounter for general adult medical examination without abnormal findings: Secondary | ICD-10-CM | POA: Diagnosis not present

## 2015-05-13 DIAGNOSIS — E785 Hyperlipidemia, unspecified: Secondary | ICD-10-CM | POA: Diagnosis not present

## 2015-05-13 DIAGNOSIS — Z1212 Encounter for screening for malignant neoplasm of rectum: Secondary | ICD-10-CM | POA: Diagnosis not present

## 2015-11-18 DIAGNOSIS — H01001 Unspecified blepharitis right upper eyelid: Secondary | ICD-10-CM | POA: Diagnosis not present

## 2015-11-18 DIAGNOSIS — H04123 Dry eye syndrome of bilateral lacrimal glands: Secondary | ICD-10-CM | POA: Diagnosis not present

## 2015-11-18 DIAGNOSIS — H01002 Unspecified blepharitis right lower eyelid: Secondary | ICD-10-CM | POA: Diagnosis not present

## 2015-11-18 DIAGNOSIS — H401331 Pigmentary glaucoma, bilateral, mild stage: Secondary | ICD-10-CM | POA: Diagnosis not present

## 2015-12-05 DIAGNOSIS — H401331 Pigmentary glaucoma, bilateral, mild stage: Secondary | ICD-10-CM | POA: Diagnosis not present

## 2015-12-12 DIAGNOSIS — H401331 Pigmentary glaucoma, bilateral, mild stage: Secondary | ICD-10-CM | POA: Diagnosis not present

## 2016-01-06 DIAGNOSIS — Z1231 Encounter for screening mammogram for malignant neoplasm of breast: Secondary | ICD-10-CM | POA: Diagnosis not present

## 2016-01-19 DIAGNOSIS — M4120 Other idiopathic scoliosis, site unspecified: Secondary | ICD-10-CM | POA: Diagnosis not present

## 2016-01-19 DIAGNOSIS — M4125 Other idiopathic scoliosis, thoracolumbar region: Secondary | ICD-10-CM | POA: Diagnosis not present

## 2016-02-12 DIAGNOSIS — J310 Chronic rhinitis: Secondary | ICD-10-CM | POA: Diagnosis not present

## 2016-02-12 DIAGNOSIS — J320 Chronic maxillary sinusitis: Secondary | ICD-10-CM | POA: Diagnosis not present

## 2016-02-17 DIAGNOSIS — H401331 Pigmentary glaucoma, bilateral, mild stage: Secondary | ICD-10-CM | POA: Diagnosis not present

## 2016-02-17 DIAGNOSIS — H01001 Unspecified blepharitis right upper eyelid: Secondary | ICD-10-CM | POA: Diagnosis not present

## 2016-02-17 DIAGNOSIS — H04123 Dry eye syndrome of bilateral lacrimal glands: Secondary | ICD-10-CM | POA: Diagnosis not present

## 2016-02-17 DIAGNOSIS — H2513 Age-related nuclear cataract, bilateral: Secondary | ICD-10-CM | POA: Diagnosis not present

## 2016-04-30 DIAGNOSIS — R8299 Other abnormal findings in urine: Secondary | ICD-10-CM | POA: Diagnosis not present

## 2016-04-30 DIAGNOSIS — N390 Urinary tract infection, site not specified: Secondary | ICD-10-CM | POA: Diagnosis not present

## 2016-05-06 DIAGNOSIS — R8299 Other abnormal findings in urine: Secondary | ICD-10-CM | POA: Diagnosis not present

## 2016-05-06 DIAGNOSIS — N390 Urinary tract infection, site not specified: Secondary | ICD-10-CM | POA: Diagnosis not present

## 2016-05-06 DIAGNOSIS — M810 Age-related osteoporosis without current pathological fracture: Secondary | ICD-10-CM | POA: Diagnosis not present

## 2016-05-06 DIAGNOSIS — E784 Other hyperlipidemia: Secondary | ICD-10-CM | POA: Diagnosis not present

## 2016-05-13 DIAGNOSIS — Z0000 Encounter for general adult medical examination without abnormal findings: Secondary | ICD-10-CM | POA: Diagnosis not present

## 2016-05-13 DIAGNOSIS — Z1389 Encounter for screening for other disorder: Secondary | ICD-10-CM | POA: Diagnosis not present

## 2016-05-13 DIAGNOSIS — Z23 Encounter for immunization: Secondary | ICD-10-CM | POA: Diagnosis not present

## 2016-05-13 DIAGNOSIS — E784 Other hyperlipidemia: Secondary | ICD-10-CM | POA: Diagnosis not present

## 2016-05-13 DIAGNOSIS — M810 Age-related osteoporosis without current pathological fracture: Secondary | ICD-10-CM | POA: Diagnosis not present

## 2016-05-13 DIAGNOSIS — J45998 Other asthma: Secondary | ICD-10-CM | POA: Diagnosis not present

## 2016-05-13 DIAGNOSIS — R3121 Asymptomatic microscopic hematuria: Secondary | ICD-10-CM | POA: Diagnosis not present

## 2016-05-13 DIAGNOSIS — R030 Elevated blood-pressure reading, without diagnosis of hypertension: Secondary | ICD-10-CM | POA: Diagnosis not present

## 2016-05-13 DIAGNOSIS — Z6824 Body mass index (BMI) 24.0-24.9, adult: Secondary | ICD-10-CM | POA: Diagnosis not present

## 2016-05-13 DIAGNOSIS — M419 Scoliosis, unspecified: Secondary | ICD-10-CM | POA: Diagnosis not present

## 2016-05-14 DIAGNOSIS — Z1212 Encounter for screening for malignant neoplasm of rectum: Secondary | ICD-10-CM | POA: Diagnosis not present

## 2016-07-13 DIAGNOSIS — H2513 Age-related nuclear cataract, bilateral: Secondary | ICD-10-CM | POA: Diagnosis not present

## 2016-07-13 DIAGNOSIS — H01001 Unspecified blepharitis right upper eyelid: Secondary | ICD-10-CM | POA: Diagnosis not present

## 2016-07-13 DIAGNOSIS — H401131 Primary open-angle glaucoma, bilateral, mild stage: Secondary | ICD-10-CM | POA: Diagnosis not present

## 2016-07-13 DIAGNOSIS — H01002 Unspecified blepharitis right lower eyelid: Secondary | ICD-10-CM | POA: Diagnosis not present

## 2016-09-28 ENCOUNTER — Encounter: Payer: Self-pay | Admitting: Obstetrics and Gynecology

## 2016-09-28 ENCOUNTER — Ambulatory Visit (INDEPENDENT_AMBULATORY_CARE_PROVIDER_SITE_OTHER): Payer: Medicare Other | Admitting: Obstetrics and Gynecology

## 2016-09-28 VITALS — BP 124/80 | HR 68 | Temp 99.0°F | Resp 16 | Ht 61.5 in | Wt 130.0 lb

## 2016-09-28 DIAGNOSIS — R319 Hematuria, unspecified: Secondary | ICD-10-CM

## 2016-09-28 DIAGNOSIS — Z87448 Personal history of other diseases of urinary system: Secondary | ICD-10-CM

## 2016-09-28 DIAGNOSIS — N390 Urinary tract infection, site not specified: Secondary | ICD-10-CM

## 2016-09-28 LAB — POCT URINALYSIS DIPSTICK
Bilirubin, UA: NEGATIVE
GLUCOSE UA: NEGATIVE
Ketones, UA: NEGATIVE
Nitrite, UA: NEGATIVE
Protein, UA: NEGATIVE
Urobilinogen, UA: NEGATIVE (ref ?–2.0)
pH, UA: 6.5 (ref 5.0–8.0)

## 2016-09-28 MED ORDER — PHENAZOPYRIDINE HCL 200 MG PO TABS: 200.0000 mg | ORAL_TABLET | Freq: Three times a day (TID) | ORAL | 0 refills | Status: DC | PRN

## 2016-09-28 MED ORDER — NITROFURANTOIN MONOHYD MACRO 100 MG PO CAPS: 100.0000 mg | ORAL_CAPSULE | Freq: Two times a day (BID) | ORAL | 0 refills | Status: DC

## 2016-09-28 NOTE — Patient Instructions (Signed)

## 2016-09-28 NOTE — Progress Notes (Signed)
GYNECOLOGY  VISIT   HPI: 71 y.o.   Married  Caucasian  female   G1P1001 with Patient's last menstrual period was 07/12/2000.   here for uti. She woke up this morning with urinary frequency and dysuria. No urinary urgency. This morning her urine was very dark, no frank blood. No fevers, no flank pain.   GYNECOLOGIC HISTORY: Patient's last menstrual period was 07/12/2000. Contraception: husband vasectomy Menopausal hormone therapy: none        OB History    Gravida Para Term Preterm AB Living   1 1 1     1    SAB TAB Ectopic Multiple Live Births                     There are no active problems to display for this patient.   Past Medical History:  Diagnosis Date  . Abnormal uterine bleeding 1998    Past Surgical History:  Procedure Laterality Date  . NASAL SINUS SURGERY  2013    Current Outpatient Prescriptions  Medication Sig Dispense Refill  . ALVESCO 80 MCG/ACT inhaler Inhale 1 puff into the lungs as needed.     . BUDESONIDE NA Place into the nose daily.    . Vitamin D, Ergocalciferol, (DRISDOL) 50000 UNITS CAPS capsule Take 50,000 Units by mouth every 7 (seven) days.   4   No current facility-administered medications for this visit.      ALLERGIES: Ibuprofen; Penicillins; and Sulfa antibiotics  Family History  Problem Relation Age of Onset  . Alzheimer's disease Mother   . Heart failure Father   . Hyperlipidemia Father   . Heart disease Brother   . Glaucoma Brother     Social History   Social History  . Marital status: Married    Spouse name: N/A  . Number of children: 1  . Years of education: N/A   Occupational History  . Not on file.   Social History Main Topics  . Smoking status: Never Smoker  . Smokeless tobacco: Never Used  . Alcohol use 0.0 oz/week     Comment: social  . Drug use: No  . Sexual activity: No     Comment: vasectomy   Other Topics Concern  . Not on file   Social History Narrative  . No narrative on file    Review of  Systems  Constitutional: Negative.   HENT: Negative.   Eyes: Negative.   Respiratory: Negative.   Cardiovascular: Negative.   Gastrointestinal: Negative.   Genitourinary: Positive for dysuria, frequency and urgency.  Musculoskeletal: Negative.   Skin: Negative.   Neurological: Negative.   Endo/Heme/Allergies: Negative.   Psychiatric/Behavioral: Negative.     PHYSICAL EXAMINATION:    BP 124/80   Pulse 68   Temp 99 F (37.2 C) (Oral)   Resp 16   Ht 5' 1.5" (1.562 m)   Wt 130 lb (59 kg)   LMP 07/12/2000   BMI 24.17 kg/m     General appearance: alert, cooperative and appears stated age Abdomen: soft, non-tender; non distended, no masses,  no organomegaly CVA: not tender    ASSESSMENT UTI ? Microscopic hematuria when not having a UTI    PLAN Will treat UTI Will have her return in 2 weeks for a urine dip, if + blood will send for micro ua only.    An After Visit Summary was printed and given to the patient.  15 minutes face to face time of which over 50% was spent in counseling.

## 2016-09-29 LAB — URINALYSIS, MICROSCOPIC ONLY
Bacteria, UA: NONE SEEN [HPF]
CASTS: NONE SEEN [LPF]
Crystals: NONE SEEN [HPF]
Yeast: NONE SEEN [HPF]

## 2016-09-29 LAB — URINE CULTURE: ORGANISM ID, BACTERIA: NO GROWTH

## 2016-09-30 ENCOUNTER — Encounter: Payer: Self-pay | Admitting: Obstetrics and Gynecology

## 2016-09-30 ENCOUNTER — Telehealth: Payer: Self-pay | Admitting: Obstetrics and Gynecology

## 2016-09-30 NOTE — Telephone Encounter (Signed)
Spoke with patient, advised as seen below per Dr. Talbert Nan. Patients allergies updated. Patient asking if she needs to keep appointment for repeat UA on 10/18/16? Patient states she needs to schedule AEX can do both at same time if needed. Advised patient would review with Dr. Talbert Nan and return call with recommendations. Patient is agreeable and verbalizes understanding.   Dr. Talbert Nan, does she need to keep 2 week UA recheck?

## 2016-09-30 NOTE — Telephone Encounter (Signed)
Spoke with patient. Patient states she picked up RX for macrobid on 3/20 to start medication. Patient reports by day 2, at lunch she was experiencing headache, dizziness and nausea. Patient states she couldn't do anything the rest of the day. Patient reports "asthma cough was triggered" and she is often sensitive to medications. Patient states she did not take anymore macrobid and feels fine today. Patient states she discussed Cipro at OV as possible alternative. Patient reports urgency and burning have gone away. Patient asking if she should switch to Cipro or wait for culture to come back? Advised patient to stop macrobid, would review with Dr. Talbert Nan and return call with recommendations. Patient is agreeable.  Dr. Talbert Nan, please review and advise?

## 2016-09-30 NOTE — Telephone Encounter (Signed)
I think she can cancel the f/u urine.

## 2016-09-30 NOTE — Telephone Encounter (Signed)
Spoke with patient, advised as seen below per Dr. Talbert Nan, appt for 10/18/16 cancelled. Patient states last AEX 04/29/15, would like to schedule AEX. Patient scheduled for 05/02/17 at 10:30am with Dr. Talbert Nan. Patient is agreeable to date and time. Patient thankful for return call.  Routing to provider for final review. Patient is agreeable to disposition. Will close encounter.

## 2016-09-30 NOTE — Telephone Encounter (Signed)
I actually just sent a note to Margaretha Sheffield to call her about her negative culture, can you please inform her of those results. She should not be on any antibiotics. If she has recurrent symptoms she should be seen. Her symptoms were not from a UTI, but from irritation. Please add macrobid as a medication sensitivity.

## 2016-09-30 NOTE — Telephone Encounter (Signed)
Patient called and reported she has "had some problems with the antibiotic prescribed" at her last visit and would like Cipro instead, if it is okay with the doctor.  Pharmacy on file is correct.

## 2016-10-18 ENCOUNTER — Other Ambulatory Visit: Payer: Medicare Other

## 2016-11-03 ENCOUNTER — Ambulatory Visit (INDEPENDENT_AMBULATORY_CARE_PROVIDER_SITE_OTHER): Payer: Medicare Other | Admitting: Obstetrics and Gynecology

## 2016-11-03 ENCOUNTER — Encounter: Payer: Self-pay | Admitting: Obstetrics and Gynecology

## 2016-11-03 VITALS — BP 140/88 | HR 76 | Resp 16 | Ht 60.5 in | Wt 129.0 lb

## 2016-11-03 DIAGNOSIS — N952 Postmenopausal atrophic vaginitis: Secondary | ICD-10-CM

## 2016-11-03 DIAGNOSIS — Z124 Encounter for screening for malignant neoplasm of cervix: Secondary | ICD-10-CM | POA: Diagnosis not present

## 2016-11-03 DIAGNOSIS — Z0000 Encounter for general adult medical examination without abnormal findings: Secondary | ICD-10-CM | POA: Diagnosis not present

## 2016-11-03 DIAGNOSIS — Z01419 Encounter for gynecological examination (general) (routine) without abnormal findings: Secondary | ICD-10-CM

## 2016-11-03 DIAGNOSIS — Z87448 Personal history of other diseases of urinary system: Secondary | ICD-10-CM

## 2016-11-03 DIAGNOSIS — R3989 Other symptoms and signs involving the genitourinary system: Secondary | ICD-10-CM | POA: Diagnosis not present

## 2016-11-03 DIAGNOSIS — N898 Other specified noninflammatory disorders of vagina: Secondary | ICD-10-CM | POA: Diagnosis not present

## 2016-11-03 LAB — POCT URINALYSIS DIPSTICK
Bilirubin, UA: NEGATIVE
Glucose, UA: NEGATIVE
KETONES UA: NEGATIVE
Nitrite, UA: NEGATIVE
PROTEIN UA: NEGATIVE
pH, UA: 5 (ref 5.0–8.0)

## 2016-11-03 MED ORDER — ESTRADIOL 10 MCG VA TABS
1.0000 | ORAL_TABLET | VAGINAL | 3 refills | Status: DC
Start: 2016-11-04 — End: 2017-11-17

## 2016-11-03 NOTE — Progress Notes (Signed)
71 y.o. G1P1001 MarriedCaucasianF here for annual exam.  She c/o vaginal dryness. She feels some vaginal pressure when she urinates, notices it recently. She has a h/o a cystocele. She doesn't notice a bulge. Feels she empties her bladder okay, sometimes double voids. Feels slightly irritated in the vaginal area for the last week. Has never used vaginal estrogen. Not sexually active. No vaginal bleeding. Not walking aware of her vagina when during the day. Feels sensitive to wipe. Slight itching in her vagina, just sometimes.  She has been using the replens which helps some    Patient's last menstrual period was 07/12/2000.          Sexually active: No.  The current method of family planning is post menopausal status.    Exercising: Yes.    Walking  Smoker:  no  Health Maintenance: Pap:  03/11/10 Normal  History of abnormal Pap:  no MMG: ~12/2015 [facility] radiology [place]: Normal  Colonoscopy:  Never BMD:  2016 Dr. Philip Aspen- Osteoporosis  TDaP: will go to PCP UA: [id number]:[id number]   reports that she has never smoked. She has never used smokeless tobacco. She reports that she drinks about 2.4 oz of alcohol per week . She reports that she does not use drugs.  Past Medical History:  Diagnosis Date  . Abnormal uterine bleeding 1998  . Scoliosis     Past Surgical History:  Procedure Laterality Date  . NASAL SINUS SURGERY  2013    Current Outpatient Prescriptions  Medication Sig Dispense Refill  . ALVESCO 80 MCG/ACT inhaler Inhale 1 puff into the lungs as needed.     . BUDESONIDE NA Place into the nose daily.    . Vitamin D, Ergocalciferol, (DRISDOL) 50000 UNITS CAPS capsule Take 50,000 Units by mouth every 7 (seven) days.   4   No current facility-administered medications for this visit.     Family History  Problem Relation Age of Onset  . Alzheimer's disease Mother   . Heart failure Father   . Hyperlipidemia Father   . Heart disease Brother   . Glaucoma Brother      Review of Systems  Constitutional: Negative.   HENT: Negative.   Eyes: Negative.   Respiratory: Negative.   Cardiovascular: Negative.   Gastrointestinal: Negative.   Endocrine: Negative.   Genitourinary: Negative.   Musculoskeletal: Negative.   Skin: Negative.   Allergic/Immunologic: Negative.   Neurological: Negative.   Hematological: Negative.   Psychiatric/Behavioral: Negative.     Exam:   BP 140/88 (BP Location: Right Arm, Patient Position: Sitting, Cuff Size: Normal)   Pulse 76   Resp 16   Ht 5' 0.5" (1.537 m)   Wt 129 lb (58.5 kg)   LMP 07/12/2000   BMI 24.78 kg/m   Weight change: @WEIGHTCHANGE @ Height:   Height: 5' 0.5" (153.7 cm)  Ht Readings from Last 3 Encounters:  11/03/16 5' 0.5" (1.537 m)  09/28/16 5' 1.5" (1.562 m)  10/23/14 5' 1.5" (1.562 m)    General appearance: alert, cooperative and appears stated age Head: Normocephalic, without obvious abnormality, atraumatic Neck: no adenopathy, supple, symmetrical, trachea midline and thyroid normal to inspection and palpation Lungs: clear to auscultation bilaterally Cardiovascular: regular rate and rhythm Breasts: normal appearance, no masses or tenderness Abdomen: soft, non-tender; bowel sounds normal; no masses,  no organomegaly Extremities: extremities normal, atraumatic, no cyanosis or edema Skin: Skin color, texture, turgor normal. No rashes or lesions Lymph nodes: Cervical, supraclavicular, and axillary nodes normal. No abnormal inguinal nodes  palpated Neurologic: Grossly normal   Pelvic: External genitalia:  no lesions              Urethra:  normal appearing urethra with no masses, tenderness or lesions              Bartholins and Skenes: normal                 Vagina: atrophic vaginal mucosa, grade 1 cystocele, no discharge, no lesions              Cervix: no lesions               Bimanual Exam:  Uterus:  normal size, contour, position, consistency, mobility, non-tender              Adnexa: no  mass, fullness, tenderness               Rectovaginal: Confirms               Anus:  normal sphincter tone, no lesions  Chaperone was present for exam.  A:  Well Woman with normal exam  Osteoporosis, f/u with primary MD  C/O bladder pressure, h/o microscopic hematuria  Vaginal atrophy  Vaginal irritation  P:   Vaginitis panel  Send urine for micro ua  Pap with reflex hpv  Send urine for micro ua  Vaginitis panel  Start vaginal estrogen  Discussed breast self exam  Discussed calcium and vit D intake  Labs with primary MD

## 2016-11-03 NOTE — Patient Instructions (Signed)

## 2016-11-04 LAB — WET PREP BY MOLECULAR PROBE
Candida species: NOT DETECTED
Gardnerella vaginalis: NOT DETECTED
Trichomonas vaginosis: NOT DETECTED

## 2016-11-04 LAB — URINALYSIS, MICROSCOPIC ONLY
Bacteria, UA: NONE SEEN [HPF]
CRYSTALS: NONE SEEN [HPF]
Casts: NONE SEEN [LPF]
RBC / HPF: NONE SEEN RBC/HPF (ref <=2)
Squamous Epithelial / LPF: NONE SEEN [HPF] (ref <=5)
Yeast: NONE SEEN [HPF]

## 2016-11-05 ENCOUNTER — Telehealth: Payer: Self-pay | Admitting: Obstetrics and Gynecology

## 2016-11-05 DIAGNOSIS — Z0110 Encounter for examination of ears and hearing without abnormal findings: Secondary | ICD-10-CM | POA: Diagnosis not present

## 2016-11-05 DIAGNOSIS — Z6824 Body mass index (BMI) 24.0-24.9, adult: Secondary | ICD-10-CM | POA: Diagnosis not present

## 2016-11-05 LAB — URINE CULTURE

## 2016-11-05 LAB — IPS PAP TEST WITH REFLEX TO HPV

## 2016-11-05 MED ORDER — CIPROFLOXACIN HCL 500 MG PO TABS: 500.0000 mg | ORAL_TABLET | Freq: Two times a day (BID) | ORAL | 0 refills | Status: DC

## 2016-11-05 NOTE — Telephone Encounter (Signed)
Phone call to report urine culture results to patient Patient stating pressure in bladder and cloudy urine. No real dysuria.   UC positive for E Coli.  Affirm negative.   Patient has multiple allergies to antibiotics and can take Ciprofloxacin.   Will send in Cipro 500 mg po bid for 3 days to her pharmacy.  Hydrate well.  Return if symptoms persist or worsen. Appreciative for the call.

## 2016-12-09 DIAGNOSIS — Z6824 Body mass index (BMI) 24.0-24.9, adult: Secondary | ICD-10-CM | POA: Diagnosis not present

## 2016-12-09 DIAGNOSIS — L209 Atopic dermatitis, unspecified: Secondary | ICD-10-CM | POA: Diagnosis not present

## 2016-12-14 DIAGNOSIS — H04123 Dry eye syndrome of bilateral lacrimal glands: Secondary | ICD-10-CM | POA: Diagnosis not present

## 2016-12-14 DIAGNOSIS — J320 Chronic maxillary sinusitis: Secondary | ICD-10-CM | POA: Diagnosis not present

## 2016-12-14 DIAGNOSIS — H01001 Unspecified blepharitis right upper eyelid: Secondary | ICD-10-CM | POA: Diagnosis not present

## 2016-12-14 DIAGNOSIS — H401131 Primary open-angle glaucoma, bilateral, mild stage: Secondary | ICD-10-CM | POA: Diagnosis not present

## 2016-12-14 DIAGNOSIS — H2513 Age-related nuclear cataract, bilateral: Secondary | ICD-10-CM | POA: Diagnosis not present

## 2017-02-14 DIAGNOSIS — R197 Diarrhea, unspecified: Secondary | ICD-10-CM | POA: Diagnosis not present

## 2017-02-14 DIAGNOSIS — J45909 Unspecified asthma, uncomplicated: Secondary | ICD-10-CM | POA: Diagnosis not present

## 2017-02-14 DIAGNOSIS — Z6822 Body mass index (BMI) 22.0-22.9, adult: Secondary | ICD-10-CM | POA: Diagnosis not present

## 2017-02-16 DIAGNOSIS — R197 Diarrhea, unspecified: Secondary | ICD-10-CM | POA: Diagnosis not present

## 2017-04-01 DIAGNOSIS — Z1231 Encounter for screening mammogram for malignant neoplasm of breast: Secondary | ICD-10-CM | POA: Diagnosis not present

## 2017-04-12 DIAGNOSIS — H2513 Age-related nuclear cataract, bilateral: Secondary | ICD-10-CM | POA: Diagnosis not present

## 2017-04-12 DIAGNOSIS — H401132 Primary open-angle glaucoma, bilateral, moderate stage: Secondary | ICD-10-CM | POA: Diagnosis not present

## 2017-04-12 DIAGNOSIS — H01001 Unspecified blepharitis right upper eyelid: Secondary | ICD-10-CM | POA: Diagnosis not present

## 2017-04-12 DIAGNOSIS — H04123 Dry eye syndrome of bilateral lacrimal glands: Secondary | ICD-10-CM | POA: Diagnosis not present

## 2017-04-26 DIAGNOSIS — Z23 Encounter for immunization: Secondary | ICD-10-CM | POA: Diagnosis not present

## 2017-05-02 ENCOUNTER — Ambulatory Visit: Payer: Self-pay | Admitting: Obstetrics and Gynecology

## 2017-09-13 DIAGNOSIS — H401132 Primary open-angle glaucoma, bilateral, moderate stage: Secondary | ICD-10-CM | POA: Diagnosis not present

## 2017-09-13 DIAGNOSIS — H2513 Age-related nuclear cataract, bilateral: Secondary | ICD-10-CM | POA: Diagnosis not present

## 2017-09-13 DIAGNOSIS — H01001 Unspecified blepharitis right upper eyelid: Secondary | ICD-10-CM | POA: Diagnosis not present

## 2017-09-13 DIAGNOSIS — H04123 Dry eye syndrome of bilateral lacrimal glands: Secondary | ICD-10-CM | POA: Diagnosis not present

## 2017-10-25 ENCOUNTER — Telehealth: Payer: Self-pay | Admitting: Obstetrics and Gynecology

## 2017-10-25 NOTE — Telephone Encounter (Addendum)
Spoke with patient. Patient inquiring about TSH and Vitamin D labs at next AEX. Has not had labs in the last year with PCP, request for labs at [facility]. Advised CBC, CMP, Lipid panel typically drawn by PCP  Denies any GYN concerns/complaints.   Patient has additional questions for billing, advised will forward message for return call, patient agreeable.

## 2017-10-25 NOTE — Telephone Encounter (Signed)
Patient would like to know if she can have thyroid and vitamin d testing at her aex appointment 5/9 or would she need to see her pcp provider.

## 2017-10-26 NOTE — Telephone Encounter (Signed)
Reviewed questions with billing and lab, call returned to patient, questions answered. Patient states she spoke with PCP this morning, will have labs drawn with PCP. Patient thankful for return call, will see Dr. Talbert Nan for AEX on 11/17/17.  Routing to provider for final review. Patient is agreeable to disposition. Will close encounter.

## 2017-11-09 ENCOUNTER — Ambulatory Visit: Payer: Medicare Other | Admitting: Obstetrics and Gynecology

## 2017-11-17 ENCOUNTER — Other Ambulatory Visit: Payer: Self-pay

## 2017-11-17 ENCOUNTER — Ambulatory Visit (INDEPENDENT_AMBULATORY_CARE_PROVIDER_SITE_OTHER): Payer: Medicare Other | Admitting: Obstetrics and Gynecology

## 2017-11-17 ENCOUNTER — Encounter: Payer: Self-pay | Admitting: Obstetrics and Gynecology

## 2017-11-17 VITALS — BP 132/80 | HR 80 | Resp 14 | Ht 61.25 in | Wt 126.0 lb

## 2017-11-17 DIAGNOSIS — Z01419 Encounter for gynecological examination (general) (routine) without abnormal findings: Secondary | ICD-10-CM

## 2017-11-17 DIAGNOSIS — M810 Age-related osteoporosis without current pathological fracture: Secondary | ICD-10-CM | POA: Diagnosis not present

## 2017-11-17 NOTE — Patient Instructions (Signed)

## 2017-11-17 NOTE — Progress Notes (Signed)
[name] here for annual exam.  H/O a grade one cystocele, not bothering her.  Just occasionally sexually active, no pain. No urinary incontinence.  She has a 2 year h/o a tooth issues, has had 4 bone grafts. Getting an implant.  She doesn't have any taste or smell, hasn't for 4 years. Thinks from a sinus infection. She has seen an ENT and an allergist. She has had a negative neurological w/u.      Patient's last menstrual period was 07/12/2000.          Sexually active: Yes.    The current method of family planning is post menopausal status.    Exercising: Yes.    walking Smoker:  no  Health Maintenance: Pap:  11-03-16 WNL  History of abnormal Pap:  no MMG:  04-01-17 WNL  Colonoscopy:  Never BMD:   2016 - Osteoporosis Dr. Sharlett Iles (primary), she declined medication.  TDaP:  unsure Gardasil: N/A   reports that she has never smoked. She has never used smokeless tobacco. She reports that she drinks about 1.8 - 2.4 oz of alcohol per week. She reports that she does not use drugs.  Past Medical History:  Diagnosis Date  . Abnormal uterine bleeding 1998  . History of Clostridium difficile infection   . Osteoporosis   . Scoliosis     Past Surgical History:  Procedure Laterality Date  . NASAL SINUS SURGERY  2013    Current Outpatient Medications  Medication Sig Dispense Refill  . BUDESONIDE NA Place into the nose daily.    . Vitamin D, Ergocalciferol, (DRISDOL) 50000 UNITS CAPS capsule Take 50,000 Units by mouth every 7 (seven) days.   4   No current facility-administered medications for this visit.   Probiotics x 2   Family History  Problem Relation Age of Onset  . Alzheimer's disease Mother   . Heart failure Father   . Hyperlipidemia Father   . Heart disease Brother   . Glaucoma Brother     Review of Systems  Constitutional: Negative.   HENT: Negative.   Eyes: Negative.   Respiratory: Negative.   Cardiovascular: Negative.    Gastrointestinal: Negative.   Endocrine: Negative.   Genitourinary: Negative.   Musculoskeletal: Negative.   Skin: Negative.   Allergic/Immunologic: Negative.   Neurological: Negative.   Psychiatric/Behavioral: Negative.     Exam:   BP 132/80 (BP Location: Right Arm, Patient Position: Sitting, Cuff Size: Normal)   Pulse 80   Resp 14   Ht 5' 1.25" (1.556 m)   Wt 126 lb (57.2 kg)   LMP 07/12/2000   BMI 23.61 kg/m   Weight change: @WEIGHTCHANGE @ Height:   Height: 5' 1.25" (155.6 cm)  Ht Readings from Last 3 Encounters:  11/17/17 5' 1.25" (1.556 m)  11/03/16 5' 0.5" (1.537 m)  09/28/16 5' 1.5" (1.562 m)    General appearance: alert, cooperative and appears stated age Head: Normocephalic, without obvious abnormality, atraumatic Neck: no adenopathy, supple, symmetrical, trachea midline and thyroid normal to inspection and palpation Lungs: clear to auscultation bilaterally Cardiovascular: regular rate and rhythm Breasts: normal appearance, no masses or tenderness Abdomen: soft, non-tender; non distended,  no masses,  no organomegaly Extremities: extremities normal, atraumatic, no cyanosis or edema Skin: Skin color, texture, turgor normal. No rashes or lesions Lymph nodes: Cervical, supraclavicular, and axillary nodes normal. No abnormal inguinal nodes palpated Neurologic: Grossly normal   Pelvic: External genitalia:  no lesions  Urethra:  normal appearing urethra with no masses, tenderness or lesions              Bartholins and Skenes: normal                 Vagina: normal appearing atrophic vagina with normal color and discharge, no lesions              Cervix: no lesions               Bimanual Exam:  Uterus:  normal size, contour, position, consistency, mobility, non-tender              Adnexa: no mass, fullness, tenderness               Rectovaginal: Confirms               Anus:  normal sphincter tone, no lesions  Chaperone was present for exam.  A:  Well  Woman with normal exam  Osteoporosis, followed by her primary. Has declined treatment.   P:   Discussed breast self exam  Discussed calcium and vit D intake  Labs with primary  Discussed the risks of osteoporosis, multiple treatment options. She will further discuss with her primary  Declines colonoscopy (3 friends died from perforation)  Given information on the Cologuard, she will discuss it with her primary

## 2017-11-18 DIAGNOSIS — R82998 Other abnormal findings in urine: Secondary | ICD-10-CM | POA: Diagnosis not present

## 2017-11-18 DIAGNOSIS — E7849 Other hyperlipidemia: Secondary | ICD-10-CM | POA: Diagnosis not present

## 2017-11-18 DIAGNOSIS — M810 Age-related osteoporosis without current pathological fracture: Secondary | ICD-10-CM | POA: Diagnosis not present

## 2017-11-21 DIAGNOSIS — L988 Other specified disorders of the skin and subcutaneous tissue: Secondary | ICD-10-CM | POA: Diagnosis not present

## 2017-11-21 DIAGNOSIS — J302 Other seasonal allergic rhinitis: Secondary | ICD-10-CM | POA: Diagnosis not present

## 2017-11-21 DIAGNOSIS — Z0000 Encounter for general adult medical examination without abnormal findings: Secondary | ICD-10-CM | POA: Diagnosis not present

## 2017-11-21 DIAGNOSIS — M810 Age-related osteoporosis without current pathological fracture: Secondary | ICD-10-CM | POA: Diagnosis not present

## 2017-11-21 DIAGNOSIS — Z6823 Body mass index (BMI) 23.0-23.9, adult: Secondary | ICD-10-CM | POA: Diagnosis not present

## 2017-11-21 DIAGNOSIS — M419 Scoliosis, unspecified: Secondary | ICD-10-CM | POA: Diagnosis not present

## 2017-11-21 DIAGNOSIS — J45998 Other asthma: Secondary | ICD-10-CM | POA: Diagnosis not present

## 2017-11-21 DIAGNOSIS — E7849 Other hyperlipidemia: Secondary | ICD-10-CM | POA: Diagnosis not present

## 2017-11-21 DIAGNOSIS — Z1389 Encounter for screening for other disorder: Secondary | ICD-10-CM | POA: Diagnosis not present

## 2017-11-25 DIAGNOSIS — Z1212 Encounter for screening for malignant neoplasm of rectum: Secondary | ICD-10-CM | POA: Diagnosis not present

## 2017-12-01 DIAGNOSIS — H04123 Dry eye syndrome of bilateral lacrimal glands: Secondary | ICD-10-CM | POA: Diagnosis not present

## 2017-12-01 DIAGNOSIS — H401132 Primary open-angle glaucoma, bilateral, moderate stage: Secondary | ICD-10-CM | POA: Diagnosis not present

## 2017-12-01 DIAGNOSIS — H2513 Age-related nuclear cataract, bilateral: Secondary | ICD-10-CM | POA: Diagnosis not present

## 2017-12-01 DIAGNOSIS — J322 Chronic ethmoidal sinusitis: Secondary | ICD-10-CM | POA: Diagnosis not present

## 2017-12-01 DIAGNOSIS — H01001 Unspecified blepharitis right upper eyelid: Secondary | ICD-10-CM | POA: Diagnosis not present

## 2018-01-19 DIAGNOSIS — H25813 Combined forms of age-related cataract, bilateral: Secondary | ICD-10-CM | POA: Diagnosis not present

## 2018-01-19 DIAGNOSIS — H04123 Dry eye syndrome of bilateral lacrimal glands: Secondary | ICD-10-CM | POA: Diagnosis not present

## 2018-01-19 DIAGNOSIS — H353131 Nonexudative age-related macular degeneration, bilateral, early dry stage: Secondary | ICD-10-CM | POA: Diagnosis not present

## 2018-04-10 DIAGNOSIS — Z1231 Encounter for screening mammogram for malignant neoplasm of breast: Secondary | ICD-10-CM | POA: Diagnosis not present

## 2018-04-12 DIAGNOSIS — Z23 Encounter for immunization: Secondary | ICD-10-CM | POA: Diagnosis not present

## 2018-08-28 ENCOUNTER — Telehealth: Payer: Self-pay | Admitting: Obstetrics and Gynecology

## 2018-08-28 NOTE — Telephone Encounter (Signed)
Patient called to request an appointment with Dr. Talbert Nan for vaginal spotting that started today.  Last seen: 11/17/17

## 2018-08-28 NOTE — Telephone Encounter (Signed)
Spoke with patient. Reports spotting that started today, vagina feels dry and irritated. Denies heavy bleeding, vaginal odor, pain, urinary symptoms, fever/chills. States she has a hx of bladder prolapse, has been on vacation and been increasing her lifting, is unsure if related. Requesting OV. OV scheduled for 2/18 at 3pm with Dr. Talbert Nan. Patient states she is scheduled for a dental procedure tomorrow as well, is unsure what time she will be finished. Patient will contact dental office and return call to office ASAP if appt needs to be rescheduled. Patient verbalizes understanding.   Routing to provider for final review. Patient is agreeable to disposition. Will close encounter.

## 2018-08-29 ENCOUNTER — Other Ambulatory Visit: Payer: Self-pay

## 2018-08-29 ENCOUNTER — Other Ambulatory Visit (HOSPITAL_COMMUNITY)
Admission: RE | Admit: 2018-08-29 | Discharge: 2018-08-29 | Disposition: A | Discharge status: Home / Self Care | Payer: Medicare Other | Source: Ambulatory Visit | Attending: Obstetrics and Gynecology | Admitting: Obstetrics and Gynecology

## 2018-08-29 ENCOUNTER — Ambulatory Visit (INDEPENDENT_AMBULATORY_CARE_PROVIDER_SITE_OTHER): Payer: Medicare Other | Admitting: Obstetrics and Gynecology

## 2018-08-29 ENCOUNTER — Encounter: Payer: Self-pay | Admitting: Obstetrics and Gynecology

## 2018-08-29 VITALS — BP 130/82 | HR 88 | Wt 124.6 lb

## 2018-08-29 DIAGNOSIS — Z124 Encounter for screening for malignant neoplasm of cervix: Secondary | ICD-10-CM | POA: Diagnosis not present

## 2018-08-29 DIAGNOSIS — N882 Stricture and stenosis of cervix uteri: Secondary | ICD-10-CM

## 2018-08-29 DIAGNOSIS — N950 Postmenopausal bleeding: Secondary | ICD-10-CM | POA: Insufficient documentation

## 2018-08-29 NOTE — Patient Instructions (Signed)

## 2018-08-29 NOTE — Progress Notes (Signed)
GYNECOLOGY  VISIT   HPI: 73 y.o.   Married White or Caucasian Not Hispanic or Latino  female   G1P1001 with Patient's last menstrual period was 07/12/2000.  here for PMB. Started spotting yesterday out of the blue (no recent intercourse). Passed several small clots this morning, no heavy bleeding. No pain.   GYNECOLOGIC HISTORY: Patient's last menstrual period was 07/12/2000. Contraception: Postmenopausal, spouse with vasectomy Menopausal hormone therapy: None       OB History    Gravida  1   Para  1   Term  1   Preterm      AB      Living  1     SAB      TAB      Ectopic      Multiple      Live Births                 There are no active problems to display for this patient.   Past Medical History:  Diagnosis Date  . Abnormal uterine bleeding 1998  . History of Clostridium difficile infection   . Osteoporosis   . Scoliosis     Past Surgical History:  Procedure Laterality Date  . NASAL SINUS SURGERY  2013    Current Outpatient Medications  Medication Sig Dispense Refill  . BUDESONIDE NA Place into the nose daily.    . Vitamin D, Ergocalciferol, (DRISDOL) 50000 UNITS CAPS capsule Take 50,000 Units by mouth every 7 (seven) days.   4   No current facility-administered medications for this visit.      ALLERGIES: Clindamycin/lincomycin; Ibuprofen; Macrobid [nitrofurantoin macrocrystal]; Penicillins; and Sulfa antibiotics  Family History  Problem Relation Age of Onset  . Alzheimer's disease Mother   . Heart failure Father   . Hyperlipidemia Father   . Heart disease Brother   . Glaucoma Brother     Social History   Socioeconomic History  . Marital status: Married    Spouse name: Not on file  . Number of children: 1  . Years of education: Not on file  . Highest education level: Not on file  Occupational History  . Not on file  Social Needs  . Financial resource strain: Not on file  . Food insecurity:    Worry: Not on file    Inability:  Not on file  . Transportation needs:    Medical: Not on file    Non-medical: Not on file  Tobacco Use  . Smoking status: Never Smoker  . Smokeless tobacco: Never Used  Substance and Sexual Activity  . Alcohol use: Yes    Alcohol/week: 3.0 - 4.0 standard drinks    Types: 3 - 4 Glasses of wine per week  . Drug use: No  . Sexual activity: Not Currently    Partners: Male    Birth control/protection: Post-menopausal, Surgical    Comment: vasectomy  Lifestyle  . Physical activity:    Days per week: Not on file    Minutes per session: Not on file  . Stress: Not on file  Relationships  . Social connections:    Talks on phone: Not on file    Gets together: Not on file    Attends religious service: Not on file    Active member of club or organization: Not on file    Attends meetings of clubs or organizations: Not on file    Relationship status: Not on file  . Intimate partner violence:    Fear  of current or ex partner: Not on file    Emotionally abused: Not on file    Physically abused: Not on file    Forced sexual activity: Not on file  Other Topics Concern  . Not on file  Social History Narrative  . Not on file    Review of Systems  Constitutional: Negative.   HENT: Negative.   Eyes: Negative.   Respiratory: Negative.   Cardiovascular: Negative.   Gastrointestinal: Negative.   Genitourinary:       PMB  Musculoskeletal: Negative.   Skin: Negative.   Neurological: Negative.   Endo/Heme/Allergies: Negative.   Psychiatric/Behavioral: Negative.     PHYSICAL EXAMINATION:    BP 130/82 (BP Location: Right Arm, Patient Position: Sitting, Cuff Size: Normal)   Pulse 88   Wt 124 lb 9.6 oz (56.5 kg)   LMP 07/12/2000   BMI 23.35 kg/m     General appearance: alert, cooperative and appears stated age Abdomen: soft, non-tender; non distended, no masses,  no organomegaly  Pelvic: External genitalia:  no lesions              Urethra:  normal appearing urethra with no masses,  tenderness or lesions              Bartholins and Skenes: normal                 Vagina: normal appearing vagina with normal color and discharge, no lesions              Cervix: no lesions and stenotic              Bimanual Exam:  Uterus:  normal size, contour, position, consistency, mobility, non-tender and retroverted              Adnexa: no mass, fullness, tenderness                The risks of endometrial biopsy were reviewed and a consent was obtained.  A speculum was placed in the vagina and the cervix was cleansed with betadine. A tenaculum was placed on the cervix and the the cervix was dilated with the mini-dilators. The mini-pipelle was placed into the endometrial cavity. The uterus sounded to ~6-7 cm. The endometrial biopsy was performed, minimal tissue was obtained. A second pass was made with the uterine evacuator, again minimal tissue was obtained. The tenaculum and speculum were removed. There were no complications.    Chaperone was present for exam.  ASSESSMENT Postmenopausal bleeding, normal exam Stenotic cervix    PLAN Pap with reflex hpv Endometrial biopsy Will return for ultrasound, possible sonohysterogram   An After Visit Summary was printed and given to the patient.

## 2018-08-31 LAB — CYTOLOGY - PAP: DIAGNOSIS: NEGATIVE

## 2018-09-04 NOTE — Progress Notes (Signed)
GYNECOLOGY  VISIT   HPI: 73 y.o.   Married White or Caucasian Not Hispanic or Latino  female   G1P1001 with Patient's last menstrual period was 07/12/2000.   here for pelvic US for PMP bleeding. Endometrial biopsy with inactive endometrium, Pap normal.    GYNECOLOGIC HISTORY: Patient's last menstrual period was 07/12/2000. Contraception: PMP Menopausal hormone therapy: none        OB History    Gravida  1   Para  1   Term  1   Preterm      AB      Living  1     SAB      TAB      Ectopic      Multiple      Live Births                 Patient Active Problem List   Diagnosis Date Noted  . Loss of smell   . Loss of taste     Past Medical History:  Diagnosis Date  . Abnormal uterine bleeding 1998  . History of Clostridium difficile infection   . Loss of smell   . Loss of taste   . Osteoporosis   . Scoliosis     Past Surgical History:  Procedure Laterality Date  . NASAL SINUS SURGERY  2013    Current Outpatient Medications  Medication Sig Dispense Refill  . BUDESONIDE NA Place into the nose daily.    . Probiotic Product (PROBIOTIC-10 PO) Take by mouth.    . Vitamin D, Ergocalciferol, (DRISDOL) 50000 UNITS CAPS capsule Take 50,000 Units by mouth every 7 (seven) days.   4   No current facility-administered medications for this visit.      ALLERGIES: Clindamycin/lincomycin; Ibuprofen; Macrobid [nitrofurantoin macrocrystal]; Penicillins; and Sulfa antibiotics  Family History  Problem Relation Age of Onset  . Alzheimer's disease Mother   . Heart failure Father   . Hyperlipidemia Father   . Heart disease Brother   . Glaucoma Brother     Social History   Socioeconomic History  . Marital status: Married    Spouse name: Not on file  . Number of children: 1  . Years of education: Not on file  . Highest education level: Not on file  Occupational History  . Not on file  Social Needs  . Financial resource strain: Not on file  . Food  insecurity:    Worry: Not on file    Inability: Not on file  . Transportation needs:    Medical: Not on file    Non-medical: Not on file  Tobacco Use  . Smoking status: Never Smoker  . Smokeless tobacco: Never Used  Substance and Sexual Activity  . Alcohol use: Yes    Alcohol/week: 3.0 - 4.0 standard drinks    Types: 3 - 4 Glasses of wine per week  . Drug use: No  . Sexual activity: Not Currently    Partners: Male    Birth control/protection: Post-menopausal, Surgical    Comment: vasectomy  Lifestyle  . Physical activity:    Days per week: Not on file    Minutes per session: Not on file  . Stress: Not on file  Relationships  . Social connections:    Talks on phone: Not on file    Gets together: Not on file    Attends religious service: Not on file    Active member of club or organization: Not on file    Attends  meetings of clubs or organizations: Not on file    Relationship status: Not on file  . Intimate partner violence:    Fear of current or ex partner: Not on file    Emotionally abused: Not on file    Physically abused: Not on file    Forced sexual activity: Not on file  Other Topics Concern  . Not on file  Social History Narrative  . Not on file    Review of Systems  Constitutional: Negative.   HENT: Negative.   Eyes: Negative.   Respiratory: Negative.   Cardiovascular: Negative.   Gastrointestinal: Negative.   Genitourinary: Negative.   Musculoskeletal: Negative.   Skin: Negative.   Neurological: Negative.   Endo/Heme/Allergies: Negative.   Psychiatric/Behavioral: Negative.   All other systems reviewed and are negative.   PHYSICAL EXAMINATION:    BP (!) 140/98 (BP Location: Right Arm, Patient Position: Sitting, Cuff Size: Normal)   Pulse 74   Ht 5' 1.25" (1.556 m)   Wt 126 lb 6.4 oz (57.3 kg)   LMP 07/12/2000   BMI 23.69 kg/m     General appearance: alert, cooperative and appears stated age Neck: no adenopathy, supple, symmetrical, trachea  midline and thyroid normal to inspection and palpation Heart: regular rate and rhythm Lungs: CTAB Abdomen: soft, non-tender; bowel sounds normal; no masses,  no organomegaly Extremities: normal, atraumatic, no cyanosis Skin: normal color, texture and turgor, no rashes or lesions Lymph: normal cervical supraclavicular and inguinal nodes Neurologic: grossly normal    Pelvic: External genitalia:  no lesions              Urethra:  normal appearing urethra with no masses, tenderness or lesions              Bartholins and Skenes: normal                 Vagina: normal appearing vagina with normal color and discharge, no lesions              Cervix: no lesions  Sonohysterogram The procedure and risks of the procedure were reviewed with the patient, consent form was signed. A speculum was placed in the vagina and the cervix was cleansed with betadine. The sonohysterogram catheter was inserted into the uterine cavity without difficulty. Saline was infused under direct observation with the ultrasound. No intrauterine masses were noted, thickening in the cervix, most c/w a high cervical polyp was noted. The catheter was removed.     Chaperone was present for exam.  ASSESSMENT Postmenopausal bleeding, inactive endometrium, normal pap, sonohysterogram with likely high endocervical polyp. Elevated BP today, no h/o HTN    PLAN Plan: hysteroscopy, dilation and curettage. Reviewed risks, including: bleeding, infection, uterine perforation She has concerns about general surgery, she will discuss with anesthesia. Suspect elevated BP secondary to concerns with procedure and need for procedure    An After Visit Summary was printed and given to the patient.  ~15 minutes face to face time of which over 50% was spent in counseling.

## 2018-09-05 ENCOUNTER — Other Ambulatory Visit: Payer: Self-pay | Admitting: Obstetrics and Gynecology

## 2018-09-05 ENCOUNTER — Encounter: Payer: Self-pay | Admitting: Obstetrics and Gynecology

## 2018-09-05 ENCOUNTER — Ambulatory Visit (INDEPENDENT_AMBULATORY_CARE_PROVIDER_SITE_OTHER): Payer: Medicare Other

## 2018-09-05 ENCOUNTER — Telehealth: Payer: Self-pay | Admitting: *Deleted

## 2018-09-05 ENCOUNTER — Ambulatory Visit (INDEPENDENT_AMBULATORY_CARE_PROVIDER_SITE_OTHER): Payer: Medicare Other | Admitting: Obstetrics and Gynecology

## 2018-09-05 VITALS — BP 140/98 | HR 74 | Ht 61.25 in | Wt 126.4 lb

## 2018-09-05 DIAGNOSIS — R430 Anosmia: Secondary | ICD-10-CM | POA: Insufficient documentation

## 2018-09-05 DIAGNOSIS — N950 Postmenopausal bleeding: Secondary | ICD-10-CM | POA: Diagnosis not present

## 2018-09-05 DIAGNOSIS — R432 Parageusia: Secondary | ICD-10-CM | POA: Insufficient documentation

## 2018-09-05 NOTE — Patient Instructions (Signed)
Hysteroscopy  Hysteroscopy is a procedure that is used to examine the inside of a woman's womb (uterus). This may be done for various reasons, including:   To look for lumps (tumors) and other growths in the uterus.   To evaluate abnormal bleeding, fibroid tumors, polyps, scar tissue (adhesions), or cancer of the uterus.   To determine the cause of an inability to get pregnant (infertility) or repeated losses of pregnancies (miscarriages).   To find a lost IUD (intrauterine device).   To perform a procedure that permanently prevents pregnancy (sterilization).  During this procedure, a thin, flexible tube with a small light and camera (hysteroscope) is used to examine the uterus. The camera sends images to a monitor in the room so that your health care provider can view the inside of your uterus. A hysteroscopy should be done right after a menstrual period to make sure that you are not pregnant.  Tell a health care provider about:   Any allergies you have.   All medicines you are taking, including vitamins, herbs, eye drops, creams, and over-the-counter medicines.   Any problems you or family members have had with the use of anesthetic medicines.   Any blood disorders you have.   Any surgeries you have had.   Any medical conditions you have.   Whether you are pregnant or may be pregnant.  What are the risks?  Generally, this is a safe procedure. However, problems may occur, including:   Excessive bleeding.   Infection.   Damage to the uterus or other structures or organs.   Allergic reaction to medicines or fluids that are used in the procedure.  What happens before the procedure?  Staying hydrated  Follow instructions from your health care provider about hydration, which may include:   Up to 2 hours before the procedure - you may continue to drink clear liquids, such as water, clear fruit juice, black coffee, and plain tea.  Eating and drinking restrictions  Follow instructions from your health care  provider about eating and drinking, which may include:   8 hours before the procedure - stop eating solid foods and drink clear liquids only   2 hours before the procedure - stop drinking clear liquids.  General instructions   Ask your health care provider about:  ? Changing or stopping your normal medicines. This is important if you take diabetes medicines or blood thinners.  ? Taking medicines such as aspirin and ibuprofen. These medicines can thin your blood and cause bleeding. Do not take these medicines for 1 week before your procedure, or as told by your health care provider.   Do not use any products that contain nicotine or tobacco for 2 weeks before the procedure. This includes cigarettes and e-cigarettes. If you need help quitting, ask your health care provider.   Medicine may be placed in your cervix the day before the procedure. This medicine causes the cervix to have a larger opening (dilate). The larger opening makes it easier for the hysteroscope to be inserted into the uterus during the procedure.   Plan to have someone with you for the first 24-48 hours after the procedure, especially if you are given a medicine to make you fall asleep (general anesthetic).   Plan to have someone take you home from the hospital or clinic.  What happens during the procedure?   To lower your risk of infection:  ? Your health care team will wash or sanitize their hands.  ? Your skin will   be washed with soap.  ? Hair may be removed from the surgical area.   An IV tube will be inserted into one of your veins.   You may be given one or more of the following:  ? A medicine to help you relax (sedative).  ? A medicine that numbs the area around the cervix (local anesthetic).  ? A medicine to make you fall asleep (general anesthetic).   A hysteroscope will be inserted through your vagina and into your uterus.   Air or fluid will be used to enlarge your uterus, enabling your health care provider to see your uterus  better. The amount of fluid used will be carefully checked throughout the procedure.   In some cases, tissue may be gently scraped from inside the uterus and sent to a lab for testing (biopsy).  The procedure may vary among health care providers and hospitals.  What happens after the procedure?   Your blood pressure, heart rate, breathing rate, and blood oxygen level will be monitored until the medicines you were given have worn off.   You may have some cramping. You may be given medicines for this.   You may have bleeding, which varies from light spotting to menstrual-like bleeding. This is normal.   If you had a biopsy done, it is your responsibility to get the results of your procedure. Ask your health care provider, or the department performing the procedure, when your results will be ready.  Summary   Hysteroscopy is a procedure that is used to examine the inside of a woman's womb (uterus).   After the procedure, you may have bleeding, which varies from light spotting to menstrual-like bleeding. This is normal. You may also have cramping.   Plan to have someone take you home from the hospital or clinic.  This information is not intended to replace advice given to you by your health care provider. Make sure you discuss any questions you have with your health care provider.  Document Released: 10/04/2000 Document Revised: 07/27/2016 Document Reviewed: 07/27/2016  [employer] Interactive Patient Education  2019 [employer]

## 2018-09-05 NOTE — Telephone Encounter (Signed)
Call to patient. Reviewed surgery date options.  Patient desires to proceed on 09-11-2018.  Advised will schedule and call her back.

## 2018-09-06 ENCOUNTER — Encounter (HOSPITAL_BASED_OUTPATIENT_CLINIC_OR_DEPARTMENT_OTHER): Payer: Self-pay | Admitting: *Deleted

## 2018-09-06 ENCOUNTER — Other Ambulatory Visit: Payer: Self-pay

## 2018-09-06 NOTE — H&P (Signed)
Progress Notes by Salvadore Dom, MD at 09/05/2018 2:00 PM  Author: Salvadore Dom, MD Author Type: Physician Filed: 09/05/2018 3:08 PM  Note Status: Signed Cosign: Cosign Not Required Encounter Date: 09/05/2018  Editor: Salvadore Dom, MD (Physician)  Prior Versions: 1. Polly Cobia, CMA (Physiological scientist) at 09/05/2018 2:37 PM - Sign when Signing Visit   2. Johnsie Kindred R (Cardiovascular Sonographer) at 09/05/2018 2:34 PM - Sign when Signing Visit   3. Polly Cobia, CMA (Physiological scientist) at 09/05/2018 2:33 PM - Sign when Signing Visit   4. Sprague, Harley Hallmark, RN (Registered Nurse) at 09/04/2018 11:39 AM - Sign when Signing Visit    GYNECOLOGY  VISIT   HPI: 73 y.o.   Married White or Caucasian Not Hispanic or Latino  female   G1P1001 with Patient's last menstrual period was 07/12/2000.   here for pelvic US for PMP bleeding. Endometrial biopsy with inactive endometrium, Pap normal.    GYNECOLOGIC HISTORY: Patient's last menstrual period was 07/12/2000. Contraception: PMP Menopausal hormone therapy: none                OB History    Gravida  1   Para  1   Term  1   Preterm      AB      Living  1     SAB      TAB      Ectopic      Multiple      Live Births                     Patient Active Problem List   Diagnosis Date Noted  . Loss of smell   . Loss of taste         Past Medical History:  Diagnosis Date  . Abnormal uterine bleeding 1998  . History of Clostridium difficile infection   . Loss of smell   . Loss of taste   . Osteoporosis   . Scoliosis          Past Surgical History:  Procedure Laterality Date  . NASAL SINUS SURGERY  2013          Current Outpatient Medications  Medication Sig Dispense Refill  . BUDESONIDE NA Place into the nose daily.    . Probiotic Product (PROBIOTIC-10 PO) Take by mouth.    . Vitamin D, Ergocalciferol, (DRISDOL) 50000 UNITS CAPS  capsule Take 50,000 Units by mouth every 7 (seven) days.   4   No current facility-administered medications for this visit.      ALLERGIES: Clindamycin/lincomycin; Ibuprofen; Macrobid [nitrofurantoin macrocrystal]; Penicillins; and Sulfa antibiotics       Family History  Problem Relation Age of Onset  . Alzheimer's disease Mother   . Heart failure Father   . Hyperlipidemia Father   . Heart disease Brother   . Glaucoma Brother     Social History        Socioeconomic History  . Marital status: Married    Spouse name: Not on file  . Number of children: 1  . Years of education: Not on file  . Highest education level: Not on file  Occupational History  . Not on file  Social Needs  . Financial resource strain: Not on file  . Food insecurity:    Worry: Not on file    Inability: Not on file  . Transportation needs:    Medical: Not on file  Non-medical: Not on file  Tobacco Use  . Smoking status: Never Smoker  . Smokeless tobacco: Never Used  Substance and Sexual Activity  . Alcohol use: Yes    Alcohol/week: 3.0 - 4.0 standard drinks    Types: 3 - 4 Glasses of wine per week  . Drug use: No  . Sexual activity: Not Currently    Partners: Male    Birth control/protection: Post-menopausal, Surgical    Comment: vasectomy  Lifestyle  . Physical activity:    Days per week: Not on file    Minutes per session: Not on file  . Stress: Not on file  Relationships  . Social connections:    Talks on phone: Not on file    Gets together: Not on file    Attends religious service: Not on file    Active member of club or organization: Not on file    Attends meetings of clubs or organizations: Not on file    Relationship status: Not on file  . Intimate partner violence:    Fear of current or ex partner: Not on file    Emotionally abused: Not on file    Physically abused: Not on file    Forced sexual activity: Not on file   Other Topics Concern  . Not on file  Social History Narrative  . Not on file    Review of Systems  Constitutional: Negative.   HENT: Negative.   Eyes: Negative.   Respiratory: Negative.   Cardiovascular: Negative.   Gastrointestinal: Negative.   Genitourinary: Negative.   Musculoskeletal: Negative.   Skin: Negative.   Neurological: Negative.   Endo/Heme/Allergies: Negative.   Psychiatric/Behavioral: Negative.   All other systems reviewed and are negative.   PHYSICAL EXAMINATION:    BP (!) 140/98 (BP Location: Right Arm, Patient Position: Sitting, Cuff Size: Normal)   Pulse 74   Ht 5' 1.25" (1.556 m)   Wt 126 lb 6.4 oz (57.3 kg)   LMP 07/12/2000   BMI 23.69 kg/m     General appearance: alert, cooperative and appears stated age Neck: no adenopathy, supple, symmetrical, trachea midline and thyroid normal to inspection and palpation Heart: regular rate and rhythm Lungs: CTAB Abdomen: soft, non-tender; bowel sounds normal; no masses,  no organomegaly Extremities: normal, atraumatic, no cyanosis Skin: normal color, texture and turgor, no rashes or lesions Lymph: normal cervical supraclavicular and inguinal nodes Neurologic: grossly normal    Pelvic: External genitalia:  no lesions              Urethra:  normal appearing urethra with no masses, tenderness or lesions              Bartholins and Skenes: normal                 Vagina: normal appearing vagina with normal color and discharge, no lesions              Cervix: no lesions  Sonohysterogram The procedure and risks of the procedure were reviewed with the patient, consent form was signed. A speculum was placed in the vagina and the cervix was cleansed with betadine. The sonohysterogram catheter was inserted into the uterine cavity without difficulty. Saline was infused under direct observation with the ultrasound. No intrauterine masses were noted, thickening in the cervix, most c/w a high cervical polyp was  noted. The catheter was removed.     Chaperone was present for exam.  ASSESSMENT Postmenopausal bleeding, inactive endometrium, normal pap, sonohysterogram with likely  high endocervical polyp. Elevated BP today, no h/o HTN    PLAN Plan: hysteroscopy, dilation and curettage. Reviewed risks, including: bleeding, infection, uterine perforation She has concerns about general surgery, she will discuss with anesthesia. Suspect elevated BP secondary to concerns with procedure and need for procedure    An After Visit Summary was printed and given to the patient.  ~15 minutes face to face time of which over 50% was spent in counseling.

## 2018-09-06 NOTE — Progress Notes (Signed)
Spoke with patient via telephone for pre op interview. NPO after MN. No medications AM of surgery. Arrival time 57.

## 2018-09-07 ENCOUNTER — Telehealth: Payer: Self-pay | Admitting: Obstetrics and Gynecology

## 2018-09-07 NOTE — Telephone Encounter (Signed)
Patient has a question about nail polish. She has a [place] and wondering if she should take off for surgery?

## 2018-09-07 NOTE — Telephone Encounter (Signed)
Spoke with patient. Advised patient to f/u with [facility] pre-op nurse for recommendations regarding nail polish. Patient states she has already spoken with pre-op nurse and was advised ok as long as polish was light. Questions answered. Patient verbalizes understanding and is agreeable.   Routing to provider for final review. Patient is agreeable to disposition. Will close encounter.

## 2018-09-08 NOTE — Telephone Encounter (Signed)
Call to patient. Surgery Information Form reviewed in detail with patient and she verbalized understanding. Patient states she has already had her pre-op appointment with [facility]. All questions answered. Two week post op scheduled for Monday 09-25-2018 at 1000. Patient agreeable to date and time of appointment.   Routing to provider and will close encounter.   Cc Lamont Snowball, RN

## 2018-09-11 ENCOUNTER — Encounter (HOSPITAL_BASED_OUTPATIENT_CLINIC_OR_DEPARTMENT_OTHER)
Admission: RE | Disposition: A | Discharge status: Home / Self Care | Payer: Self-pay | Source: Home / Self Care | Attending: Obstetrics and Gynecology

## 2018-09-11 ENCOUNTER — Ambulatory Visit (HOSPITAL_BASED_OUTPATIENT_CLINIC_OR_DEPARTMENT_OTHER): Payer: Medicare Other | Admitting: Anesthesiology

## 2018-09-11 ENCOUNTER — Encounter (HOSPITAL_BASED_OUTPATIENT_CLINIC_OR_DEPARTMENT_OTHER): Payer: Self-pay | Admitting: *Deleted

## 2018-09-11 ENCOUNTER — Other Ambulatory Visit: Payer: Self-pay

## 2018-09-11 ENCOUNTER — Ambulatory Visit (HOSPITAL_BASED_OUTPATIENT_CLINIC_OR_DEPARTMENT_OTHER)
Admission: RE | Admit: 2018-09-11 | Discharge: 2018-09-11 | Disposition: A | Discharge status: Home / Self Care | Payer: Medicare Other | Attending: Obstetrics and Gynecology | Admitting: Obstetrics and Gynecology

## 2018-09-11 DIAGNOSIS — N814 Uterovaginal prolapse, unspecified: Secondary | ICD-10-CM | POA: Diagnosis not present

## 2018-09-11 DIAGNOSIS — Z79899 Other long term (current) drug therapy: Secondary | ICD-10-CM | POA: Diagnosis not present

## 2018-09-11 DIAGNOSIS — N950 Postmenopausal bleeding: Secondary | ICD-10-CM | POA: Insufficient documentation

## 2018-09-11 DIAGNOSIS — N858 Other specified noninflammatory disorders of uterus: Secondary | ICD-10-CM | POA: Diagnosis not present

## 2018-09-11 DIAGNOSIS — N840 Polyp of corpus uteri: Secondary | ICD-10-CM | POA: Diagnosis not present

## 2018-09-11 DIAGNOSIS — N841 Polyp of cervix uteri: Secondary | ICD-10-CM | POA: Insufficient documentation

## 2018-09-11 DIAGNOSIS — N812 Incomplete uterovaginal prolapse: Secondary | ICD-10-CM | POA: Diagnosis not present

## 2018-09-11 DIAGNOSIS — R030 Elevated blood-pressure reading, without diagnosis of hypertension: Secondary | ICD-10-CM | POA: Diagnosis not present

## 2018-09-11 HISTORY — PX: DILATATION & CURETTAGE/HYSTEROSCOPY WITH MYOSURE: SHX6511

## 2018-09-11 LAB — CBC
HCT: 42.8 % (ref 36.0–46.0)
Hemoglobin: 13.6 g/dL (ref 12.0–15.0)
MCH: 31.1 pg (ref 26.0–34.0)
MCHC: 31.8 g/dL (ref 30.0–36.0)
MCV: 97.7 fL (ref 80.0–100.0)
Platelets: 229 10*3/uL (ref 150–400)
RBC: 4.38 MIL/uL (ref 3.87–5.11)
RDW: 13.4 % (ref 11.5–15.5)
WBC: 5.1 10*3/uL (ref 4.0–10.5)
nRBC: 0 % (ref 0.0–0.2)

## 2018-09-11 LAB — COMPREHENSIVE METABOLIC PANEL
ALT: 20 U/L (ref 0–44)
AST: 23 U/L (ref 15–41)
Albumin: 4.7 g/dL (ref 3.5–5.0)
Alkaline Phosphatase: 55 U/L (ref 38–126)
Anion gap: 9 (ref 5–15)
BUN: 16 mg/dL (ref 8–23)
CO2: 28 mmol/L (ref 22–32)
Calcium: 9.2 mg/dL (ref 8.9–10.3)
Chloride: 104 mmol/L (ref 98–111)
Creatinine, Ser: 0.83 mg/dL (ref 0.44–1.00)
GFR calc Af Amer: >60 mL/min (ref >60)
GFR calc non Af Amer: >60 mL/min (ref >60)
Glucose, Bld: 97 mg/dL (ref 70–99)
Potassium: 3.5 mmol/L (ref 3.5–5.1)
SODIUM: 141 mmol/L (ref 135–145)
Total Bilirubin: 1.5 mg/dL — ABNORMAL HIGH (ref 0.3–1.2)
Total Protein: 7.3 g/dL (ref 6.5–8.1)

## 2018-09-11 SURGERY — DILATATION & CURETTAGE/HYSTEROSCOPY WITH MYOSURE
Anesthesia: General | Site: Uterus

## 2018-09-11 MED ORDER — PROPOFOL 10 MG/ML IV BOLUS
INTRAVENOUS | Status: DC | PRN
  Administered 2018-09-11: 100 mg via INTRAVENOUS

## 2018-09-11 MED ORDER — PROPOFOL 10 MG/ML IV BOLUS
INTRAVENOUS | Status: AC
  Filled 2018-09-11: qty 20

## 2018-09-11 MED ORDER — FENTANYL CITRATE (PF) 100 MCG/2ML IJ SOLN
INTRAMUSCULAR | Status: AC
  Filled 2018-09-11: qty 2

## 2018-09-11 MED ORDER — DEXAMETHASONE SODIUM PHOSPHATE 10 MG/ML IJ SOLN
INTRAMUSCULAR | Status: DC | PRN
  Administered 2018-09-11: 5 mg via INTRAVENOUS

## 2018-09-11 MED ORDER — SODIUM CHLORIDE 0.9 % IR SOLN
Status: DC | PRN
  Administered 2018-09-11: 3000 mL

## 2018-09-11 MED ORDER — FENTANYL CITRATE (PF) 100 MCG/2ML IJ SOLN
25.0000 ug | INTRAMUSCULAR | Status: DC | PRN
  Filled 2018-09-11: qty 1

## 2018-09-11 MED ORDER — LIDOCAINE 2% (20 MG/ML) 5 ML SYRINGE
INTRAMUSCULAR | Status: AC
  Filled 2018-09-11: qty 5

## 2018-09-11 MED ORDER — ACETAMINOPHEN 10 MG/ML IV SOLN
INTRAVENOUS | Status: DC | PRN
  Administered 2018-09-11: 1000 mg via INTRAVENOUS

## 2018-09-11 MED ORDER — ONDANSETRON HCL 4 MG/2ML IJ SOLN
INTRAMUSCULAR | Status: DC | PRN
  Administered 2018-09-11: 4 mg via INTRAVENOUS

## 2018-09-11 MED ORDER — PROMETHAZINE HCL 25 MG/ML IJ SOLN
6.2500 mg | INTRAMUSCULAR | Status: DC | PRN
  Filled 2018-09-11: qty 1

## 2018-09-11 MED ORDER — LIDOCAINE 2% (20 MG/ML) 5 ML SYRINGE
INTRAMUSCULAR | Status: DC | PRN
  Administered 2018-09-11: 50 mg via INTRAVENOUS

## 2018-09-11 MED ORDER — FENTANYL CITRATE (PF) 100 MCG/2ML IJ SOLN
INTRAMUSCULAR | Status: DC | PRN
  Administered 2018-09-11: 50 ug via INTRAVENOUS

## 2018-09-11 MED ORDER — LACTATED RINGERS IV SOLN
INTRAVENOUS | Status: DC
  Filled 2018-09-11: qty 1000

## 2018-09-11 MED ORDER — DEXAMETHASONE SODIUM PHOSPHATE 10 MG/ML IJ SOLN
INTRAMUSCULAR | Status: AC
  Filled 2018-09-11: qty 1

## 2018-09-11 MED ORDER — LACTATED RINGERS IV SOLN
INTRAVENOUS | Status: DC
  Administered 2018-09-11: 07:00:00 via INTRAVENOUS
  Filled 2018-09-11: qty 1000

## 2018-09-11 MED ORDER — ONDANSETRON HCL 4 MG/2ML IJ SOLN
INTRAMUSCULAR | Status: AC
  Filled 2018-09-11: qty 2

## 2018-09-11 SURGICAL SUPPLY — 26 items
CANISTER SUCT 3000ML PPV (MISCELLANEOUS) IMPLANT
CATH ROBINSON RED A/P 16FR (CATHETERS) ×3 IMPLANT
DEVICE MYOSURE LITE (MISCELLANEOUS) ×3 IMPLANT
DEVICE MYOSURE REACH (MISCELLANEOUS) IMPLANT
DILATOR CANAL MILEX (MISCELLANEOUS) IMPLANT
GAUZE 4X4 16PLY RFD (DISPOSABLE) ×6 IMPLANT
GLOVE BIO SURGEON STRL SZ 6 (GLOVE) ×3 IMPLANT
GLOVE BIO SURGEON STRL SZ 6.5 (GLOVE) ×2 IMPLANT
GLOVE BIO SURGEONS STRL SZ 6.5 (GLOVE) ×1
GLOVE BIOGEL PI IND STRL 6.5 (GLOVE) ×1 IMPLANT
GLOVE BIOGEL PI IND STRL 7.5 (GLOVE) ×1 IMPLANT
GLOVE BIOGEL PI INDICATOR 6.5 (GLOVE) ×2
GLOVE BIOGEL PI INDICATOR 7.5 (GLOVE) ×2
GOWN STRL REUS W/TWL LRG LVL3 (GOWN DISPOSABLE) ×6 IMPLANT
IV NS IRRIG 3000ML ARTHROMATIC (IV SOLUTION) ×3 IMPLANT
KIT PROCEDURE FLUENT (KITS) ×3 IMPLANT
KIT TURNOVER CYSTO (KITS) ×3 IMPLANT
MYOSURE XL FIBROID (MISCELLANEOUS)
PACK VAGINAL MINOR WOMEN LF (CUSTOM PROCEDURE TRAY) ×3 IMPLANT
PAD OB MATERNITY 4.3X12.25 (PERSONAL CARE ITEMS) ×3 IMPLANT
PAD PREP 24X48 CUFFED NSTRL (MISCELLANEOUS) ×3 IMPLANT
SEAL ROD LENS SCOPE MYOSURE (ABLATOR) ×3 IMPLANT
SYR 20CC LL (SYRINGE) IMPLANT
SYSTEM TISS REMOVAL MYOSURE XL (MISCELLANEOUS) IMPLANT
TOWEL OR 17X26 10 PK STRL BLUE (TOWEL DISPOSABLE) ×6 IMPLANT
WATER STERILE IRR 500ML POUR (IV SOLUTION) IMPLANT

## 2018-09-11 NOTE — Anesthesia Postprocedure Evaluation (Signed)
Anesthesia Post Note  Patient: Genova Kiner  Procedure(s) Performed: DILATATION & CURETTAGE/HYSTEROSCOPY WITH MYOSURE (N/A Uterus)     Patient location during evaluation: PACU Anesthesia Type: General Level of consciousness: awake and alert Pain management: pain level controlled Vital Signs Assessment: post-procedure vital signs reviewed and stable Respiratory status: spontaneous breathing, nonlabored ventilation, respiratory function stable and patient connected to nasal cannula oxygen Cardiovascular status: blood pressure returned to baseline and stable Postop Assessment: no apparent nausea or vomiting Anesthetic complications: no    Last Vitals:  Vitals:   09/11/18 0915 09/11/18 0930  BP: (!) 158/72 (!) 158/80  Pulse: 65 (!) 58  Resp: 13 13  Temp:    SpO2: 100% 100%    Last Pain:  Vitals:   09/11/18 0930  TempSrc:   PainSc: 0-No pain                 Xayvion Shirah S

## 2018-09-11 NOTE — Discharge Instructions (Addendum)
DISCHARGE INSTRUCTIONS: D&C / Hysteroscopy with Myosure The following instructions have been prepared to help you care for yourself upon your return home.   **You may begin taking Tylenol at 2:27 pm today**  Personal hygiene:  Use sanitary pads for vaginal drainage, not tampons.  Shower the day after your procedure.  NO tub baths, pools or Jacuzzis for 2-3 weeks.  Wipe front to back after using the bathroom.  Activity and limitations:  Do NOT drive or operate any equipment for 24 hours. The effects of anesthesia are still present and drowsiness may result.  Do NOT rest in bed all day.  Walking is encouraged.  Walk up and down stairs slowly.  You may resume your normal activity in one to two days or as indicated by your physician.  Sexual activity: NO intercourse for at least 2 weeks after the procedure, or as indicated by your physician.  Diet: Eat a light meal as desired this evening. You may resume your usual diet tomorrow.  Return to work: You may resume your work activities in one to two days or as indicated by your doctor.  What to expect after your surgery: Expect to have vaginal bleeding/discharge for 2-3 days and spotting for up to 10 days. It is not unusual to have soreness for up to 1-2 weeks. You may have a slight burning sensation when you urinate for the first day. Mild cramps may continue for a couple of days. You may have a regular period in 2-6 weeks.  Call your doctor for any of the following:  Excessive vaginal bleeding, saturating and changing one pad every hour.  Inability to urinate 6 hours after discharge from hospital.  Pain not relieved by pain medication.  Fever of 100.4 F or greater.  Unusual vaginal discharge or odor.    Post Anesthesia Home Care Instructions  Activity: Get plenty of rest for the remainder of the day. A responsible adult should stay with you for 24 hours following the procedure.  For the next 24 hours, DO NOT: -Drive a  car -Paediatric nurse -Drink alcoholic beverages -Take any medication unless instructed by your physician -Make any legal decisions or sign important papers.  Meals: Start with liquid foods such as gelatin or soup. Progress to regular foods as tolerated. Avoid greasy, spicy, heavy foods. If nausea and/or vomiting occur, drink only clear liquids until the nausea and/or vomiting subsides. Call your physician if vomiting continues.  Special Instructions/Symptoms: Your throat may feel dry or sore from the anesthesia or the breathing tube placed in your throat during surgery. If this causes discomfort, gargle with warm salt water. The discomfort should disappear within 24 hours.

## 2018-09-11 NOTE — Transfer of Care (Signed)
Immediate Anesthesia Transfer of Care Note  Patient: Kerry White  Procedure(s) Performed: DILATATION & CURETTAGE/HYSTEROSCOPY WITH MYOSURE (N/A )  Patient Location: PACU  Anesthesia Type:General  Level of Consciousness: awake, alert  and oriented  Airway & Oxygen Therapy: Patient Spontanous Breathing and Patient connected to nasal cannula oxygen  Post-op Assessment: Report given to RN and Post -op Vital signs reviewed and stable  Post vital signs: Reviewed and stable  Last Vitals:  Vitals Value Taken Time  BP 140/99 09/11/2018  8:49 AM  Temp 36.6 C 09/11/2018  8:49 AM  Pulse 70 09/11/2018  8:54 AM  Resp 18 09/11/2018  8:54 AM  SpO2 100 % 09/11/2018  8:54 AM  Vitals shown include unvalidated device data.  Last Pain:  Vitals:   09/11/18 0652  TempSrc:   PainSc: 0-No pain      Patients Stated Pain Goal: 5 (69/79/48 0165)  Complications: No apparent anesthesia complications

## 2018-09-11 NOTE — Interval H&P Note (Signed)
History and Physical Interval Note:  09/11/2018 7:20 AM  Kerry White  has presented today for surgery, with the diagnosis of PMB, possible endocervical polyp  The various methods of treatment have been discussed with the patient and family. After consideration of risks, benefits and other options for treatment, the patient has consented to  Procedure(s) with comments: [place] (N/A) - possible high endocervical polyp. Follow prefvious case. as a surgical intervention .  The patient's history has been reviewed, patient examined, no change in status, stable for surgery.  I have reviewed the patient's chart and labs.  Questions were answered to the patient's satisfaction.     Salvadore Dom

## 2018-09-11 NOTE — Anesthesia Procedure Notes (Signed)
Procedure Name: LMA Insertion Date/Time: 09/11/2018 8:22 AM Performed by: Gwyndolyn Saxon, CRNA Pre-anesthesia Checklist: Patient identified, Emergency Drugs available, Suction available and Patient being monitored Patient Re-evaluated:Patient Re-evaluated prior to induction Oxygen Delivery Method: Circle system utilized Preoxygenation: Pre-oxygenation with 100% oxygen Induction Type: IV induction Ventilation: Mask ventilation without difficulty LMA: LMA inserted LMA Size: 3.0 Number of attempts: 1 Placement Confirmation: positive ETCO2 and breath sounds checked- equal and bilateral Tube secured with: Tape Dental Injury: Teeth and Oropharynx as per pre-operative assessment

## 2018-09-11 NOTE — Anesthesia Preprocedure Evaluation (Signed)
Anesthesia Evaluation  Patient identified by MRN, date of birth, ID band Patient awake    Reviewed: Allergy & Precautions, NPO status , Patient's Chart, lab work & pertinent test results  Airway Mallampati: II  TM Distance: >3 FB Neck ROM: Full    Dental no notable dental hx.    Pulmonary neg pulmonary ROS,    Pulmonary exam normal breath sounds clear to auscultation       Cardiovascular negative cardio ROS Normal cardiovascular exam Rhythm:Regular Rate:Normal     Neuro/Psych negative neurological ROS  negative psych ROS   GI/Hepatic negative GI ROS, Neg liver ROS,   Endo/Other  negative endocrine ROS  Renal/GU negative Renal ROS  negative genitourinary   Musculoskeletal negative musculoskeletal ROS (+)   Abdominal   Peds negative pediatric ROS (+)  Hematology negative hematology ROS (+)   Anesthesia Other Findings   Reproductive/Obstetrics negative OB ROS                             Anesthesia Physical Anesthesia Plan  ASA: I  Anesthesia Plan: General   Post-op Pain Management:    Induction: Intravenous  PONV Risk Score and Plan: 3 and Ondansetron, Dexamethasone and Treatment may vary due to age or medical condition  Airway Management Planned: LMA  Additional Equipment:   Intra-op Plan:   Post-operative Plan: Extubation in OR  Informed Consent: I have reviewed the patients History and Physical, chart, labs and discussed the procedure including the risks, benefits and alternatives for the proposed anesthesia with the patient or authorized representative who has indicated his/her understanding and acceptance.     Dental advisory given  Plan Discussed with: CRNA and Surgeon  Anesthesia Plan Comments:         Anesthesia Quick Evaluation

## 2018-09-11 NOTE — Op Note (Signed)
Preoperative Diagnosis: postmenopausal bleeding  Postoperative Diagnosis: same  Procedure: Hysteroscopy, removal of endocervical polyp, dilation and curettage  Surgeon: Dr Sumner Boast  Assistants: None  Anesthesia: General via LMA  EBL: 2 cc  Fluids: 500 cc LR  Fluid deficit: 125 cc  Urine output: not recorded  Indications for surgery: The patient is a 73 yo female, who presented with postmenopausal bleeding. Work up included a normal pap, endometrial biopsy with inactive endometrium and a sonohysterogram with what appeared to be a high endocervical polyp The risks of the surgery were reviewed with the patient and the consent form was signed prior to her surgery.  Findings: EUA: small uterus, no adnexal masses. The patient has a small grade 2 cystocele and grade 1-2 uterine prolapse. Hysteroscopy: thin endometrium, normal tubal ostia bilaterally. Small endocervical polyp.   Specimens: Endocervical polyp, endocervical curettage, endometrial curettage   Procedure: The patient was taken to the operating room with an IV in place. She was placed in the dorsal lithotomy position and anesthesia was administered. She was prepped and draped in the usual sterile fashion for a vaginal procedure. She voided on the way to the OR. A weighted speculum was placed in the vagina and a single tooth tenaculum was placed on the anterior lip of the cervix. The cervix was dilated to a #7 hagar dilator. The uterus was sounded to 6 cm. The myosure hysteroscope was inserted into the uterine cavity. With continuous infusion of normal saline, the uterine cavity was visualized with the above findings. The myosure light was used to resect the endocervical polyp. The myosure was then removed. The cavity was then curetted with the small sharp curette. The cavity had the characteristically gritty texture at the end of the procedure. The curette and the single tooth tenaculum were removed. The speculum was removed. The  patients perineum was cleansed of betadine and she was taken out of the dorsal lithotomy position.  Upon awakening the LMA was removed and the patient was transferred to the recovery room in stable and awake condition.  The sponge and instrument count were correct. There were no complications.

## 2018-09-12 ENCOUNTER — Encounter (HOSPITAL_BASED_OUTPATIENT_CLINIC_OR_DEPARTMENT_OTHER): Payer: Self-pay | Admitting: Obstetrics and Gynecology

## 2018-09-25 ENCOUNTER — Encounter: Payer: Self-pay | Admitting: Obstetrics and Gynecology

## 2018-09-25 ENCOUNTER — Ambulatory Visit (INDEPENDENT_AMBULATORY_CARE_PROVIDER_SITE_OTHER): Payer: Medicare Other | Admitting: Obstetrics and Gynecology

## 2018-09-25 ENCOUNTER — Other Ambulatory Visit: Payer: Self-pay

## 2018-09-25 VITALS — BP 122/70 | HR 76 | Resp 14 | Ht 61.25 in | Wt 126.0 lb

## 2018-09-25 DIAGNOSIS — Z9889 Other specified postprocedural states: Secondary | ICD-10-CM

## 2018-09-25 NOTE — Progress Notes (Signed)
GYNECOLOGY  VISIT   HPI: 73 y.o.   Married White or Caucasian Not Hispanic or Latino  female   G1P1001 with Patient's last menstrual period was 07/12/2000.   here for 2 week post op, she is doing well. Bleed lightly for 5 days. No concerns.  DILATATION & CURETTAGE/HYSTEROSCOPY WITH MYOSURE (N/A Uterus) . Pathology was benign.   GYNECOLOGIC HISTORY: Patient's last menstrual period was 07/12/2000. Contraception:Postmenopausal Menopausal hormone therapy: none        OB History    Gravida  1   Para  1   Term  1   Preterm      AB      Living  1     SAB      TAB      Ectopic      Multiple      Live Births                 Patient Active Problem List   Diagnosis Date Noted  . Loss of smell   . Loss of taste     Past Medical History:  Diagnosis Date  . Abnormal uterine bleeding 1998  . History of Clostridium difficile infection   . Loss of smell   . Loss of taste   . Osteoporosis   . Scoliosis     Past Surgical History:  Procedure Laterality Date  . DILATATION & CURETTAGE/HYSTEROSCOPY WITH MYOSURE N/A 09/11/2018   Procedure: DILATATION & CURETTAGE/HYSTEROSCOPY WITH MYOSURE;  Surgeon: Salvadore Dom, MD;  Location: [facility];  Service: Gynecology;  Laterality: N/A;  . NASAL SINUS SURGERY  2013    Current Outpatient Medications  Medication Sig Dispense Refill  . BUDESONIDE NA Place into the nose daily.    . multivitamin-lutein (OCUVITE-LUTEIN) CAPS capsule Take 1 capsule by mouth daily.    . Probiotic Product (PROBIOTIC-10 PO) Take by mouth.    . Vitamin D, Ergocalciferol, (DRISDOL) 50000 UNITS CAPS capsule Take 50,000 Units by mouth every 7 (seven) days.   4   No current facility-administered medications for this visit.      ALLERGIES: Clindamycin/lincomycin; Ibuprofen; Macrobid [nitrofurantoin macrocrystal]; Penicillins; and Sulfa antibiotics  Family History  Problem Relation Age of Onset  . Alzheimer's disease Mother   .  Heart failure Father   . Hyperlipidemia Father   . Heart disease Brother   . Glaucoma Brother     Social History   Socioeconomic History  . Marital status: Married    Spouse name: Not on file  . Number of children: 1  . Years of education: Not on file  . Highest education level: Not on file  Occupational History  . Not on file  Social Needs  . Financial resource strain: Not on file  . Food insecurity:    Worry: Not on file    Inability: Not on file  . Transportation needs:    Medical: Not on file    Non-medical: Not on file  Tobacco Use  . Smoking status: Never Smoker  . Smokeless tobacco: Never Used  Substance and Sexual Activity  . Alcohol use: Yes    Alcohol/week: 3.0 - 4.0 standard drinks    Types: 3 - 4 Glasses of wine per week  . Drug use: No  . Sexual activity: Not Currently    Partners: Male    Birth control/protection: Post-menopausal, Surgical    Comment: vasectomy  Lifestyle  . Physical activity:    Days per week: Not on file  Minutes per session: Not on file  . Stress: Not on file  Relationships  . Social connections:    Talks on phone: Not on file    Gets together: Not on file    Attends religious service: Not on file    Active member of club or organization: Not on file    Attends meetings of clubs or organizations: Not on file    Relationship status: Not on file  . Intimate partner violence:    Fear of current or ex partner: Not on file    Emotionally abused: Not on file    Physically abused: Not on file    Forced sexual activity: Not on file  Other Topics Concern  . Not on file  Social History Narrative  . Not on file    Review of Systems  Constitutional: Negative.   HENT: Negative.   Eyes: Negative.   Respiratory: Negative.   Cardiovascular: Negative.   Gastrointestinal: Negative.   Genitourinary: Negative.   Musculoskeletal: Negative.   Skin: Negative.   Neurological: Negative.   Endo/Heme/Allergies: Negative.    Psychiatric/Behavioral: Negative.     PHYSICAL EXAMINATION:    BP 122/70 (BP Location: Right Arm, Patient Position: Sitting, Cuff Size: Normal)   Pulse 76   Resp 14   Ht 5' 1.25" (1.556 m)   Wt 126 lb (57.2 kg)   LMP 07/12/2000   BMI 23.61 kg/m     General appearance: alert, cooperative and appears stated age  ASSESSMENT S/P hysteroscopy, polypectomy, D&C. Benign pathology. Doing well    PLAN Routine f/u Call with any concerns   An After Visit Summary was printed and given to the patient.

## 2018-11-20 DIAGNOSIS — M810 Age-related osteoporosis without current pathological fracture: Secondary | ICD-10-CM | POA: Diagnosis not present

## 2018-11-20 DIAGNOSIS — E7849 Other hyperlipidemia: Secondary | ICD-10-CM | POA: Diagnosis not present

## 2018-11-21 DIAGNOSIS — R82998 Other abnormal findings in urine: Secondary | ICD-10-CM | POA: Diagnosis not present

## 2018-11-21 DIAGNOSIS — R030 Elevated blood-pressure reading, without diagnosis of hypertension: Secondary | ICD-10-CM | POA: Diagnosis not present

## 2018-11-27 DIAGNOSIS — M419 Scoliosis, unspecified: Secondary | ICD-10-CM | POA: Diagnosis not present

## 2018-11-27 DIAGNOSIS — Z1339 Encounter for screening examination for other mental health and behavioral disorders: Secondary | ICD-10-CM | POA: Diagnosis not present

## 2018-11-27 DIAGNOSIS — E785 Hyperlipidemia, unspecified: Secondary | ICD-10-CM | POA: Diagnosis not present

## 2018-11-27 DIAGNOSIS — Z1331 Encounter for screening for depression: Secondary | ICD-10-CM | POA: Diagnosis not present

## 2018-11-27 DIAGNOSIS — L989 Disorder of the skin and subcutaneous tissue, unspecified: Secondary | ICD-10-CM | POA: Diagnosis not present

## 2018-11-27 DIAGNOSIS — M810 Age-related osteoporosis without current pathological fracture: Secondary | ICD-10-CM | POA: Diagnosis not present

## 2018-11-27 DIAGNOSIS — Z0000 Encounter for general adult medical examination without abnormal findings: Secondary | ICD-10-CM | POA: Diagnosis not present

## 2018-12-06 DIAGNOSIS — Z1212 Encounter for screening for malignant neoplasm of rectum: Secondary | ICD-10-CM | POA: Diagnosis not present

## 2018-12-06 DIAGNOSIS — Z1211 Encounter for screening for malignant neoplasm of colon: Secondary | ICD-10-CM | POA: Diagnosis not present

## 2018-12-20 ENCOUNTER — Ambulatory Visit: Payer: Medicare Other | Admitting: Obstetrics and Gynecology

## 2019-01-01 DIAGNOSIS — R197 Diarrhea, unspecified: Secondary | ICD-10-CM | POA: Diagnosis not present

## 2019-01-01 DIAGNOSIS — J45998 Other asthma: Secondary | ICD-10-CM | POA: Diagnosis not present

## 2019-01-01 DIAGNOSIS — R195 Other fecal abnormalities: Secondary | ICD-10-CM | POA: Diagnosis not present

## 2019-01-04 DIAGNOSIS — D122 Benign neoplasm of ascending colon: Secondary | ICD-10-CM | POA: Diagnosis not present

## 2019-01-04 DIAGNOSIS — R195 Other fecal abnormalities: Secondary | ICD-10-CM | POA: Diagnosis not present

## 2019-01-04 DIAGNOSIS — Z1211 Encounter for screening for malignant neoplasm of colon: Secondary | ICD-10-CM | POA: Diagnosis not present

## 2019-01-04 DIAGNOSIS — D125 Benign neoplasm of sigmoid colon: Secondary | ICD-10-CM | POA: Diagnosis not present

## 2019-01-04 DIAGNOSIS — K5730 Diverticulosis of large intestine without perforation or abscess without bleeding: Secondary | ICD-10-CM | POA: Diagnosis not present

## 2019-01-04 DIAGNOSIS — K635 Polyp of colon: Secondary | ICD-10-CM | POA: Diagnosis not present

## 2019-03-27 DIAGNOSIS — J321 Chronic frontal sinusitis: Secondary | ICD-10-CM | POA: Diagnosis not present

## 2019-03-27 DIAGNOSIS — J320 Chronic maxillary sinusitis: Secondary | ICD-10-CM | POA: Diagnosis not present

## 2019-03-27 DIAGNOSIS — J323 Chronic sphenoidal sinusitis: Secondary | ICD-10-CM | POA: Diagnosis not present

## 2019-03-27 DIAGNOSIS — J322 Chronic ethmoidal sinusitis: Secondary | ICD-10-CM | POA: Diagnosis not present

## 2019-04-13 DIAGNOSIS — Z23 Encounter for immunization: Secondary | ICD-10-CM | POA: Diagnosis not present

## 2019-05-15 DIAGNOSIS — H401132 Primary open-angle glaucoma, bilateral, moderate stage: Secondary | ICD-10-CM | POA: Diagnosis not present

## 2019-05-15 DIAGNOSIS — H52203 Unspecified astigmatism, bilateral: Secondary | ICD-10-CM | POA: Diagnosis not present

## 2019-06-26 DIAGNOSIS — H401132 Primary open-angle glaucoma, bilateral, moderate stage: Secondary | ICD-10-CM | POA: Diagnosis not present

## 2019-08-19 ENCOUNTER — Ambulatory Visit: Payer: Medicare Other

## 2019-09-03 ENCOUNTER — Ambulatory Visit: Payer: Medicare Other

## 2019-10-26 DIAGNOSIS — H401132 Primary open-angle glaucoma, bilateral, moderate stage: Secondary | ICD-10-CM | POA: Diagnosis not present

## 2019-11-23 DIAGNOSIS — M810 Age-related osteoporosis without current pathological fracture: Secondary | ICD-10-CM | POA: Diagnosis not present

## 2019-11-23 DIAGNOSIS — E7849 Other hyperlipidemia: Secondary | ICD-10-CM | POA: Diagnosis not present

## 2020-01-25 DIAGNOSIS — J0100 Acute maxillary sinusitis, unspecified: Secondary | ICD-10-CM | POA: Diagnosis not present

## 2020-01-25 DIAGNOSIS — Z1152 Encounter for screening for COVID-19: Secondary | ICD-10-CM | POA: Diagnosis not present

## 2020-02-05 DIAGNOSIS — R197 Diarrhea, unspecified: Secondary | ICD-10-CM | POA: Diagnosis not present

## 2020-02-29 DIAGNOSIS — H401132 Primary open-angle glaucoma, bilateral, moderate stage: Secondary | ICD-10-CM | POA: Diagnosis not present

## 2020-03-26 DIAGNOSIS — J322 Chronic ethmoidal sinusitis: Secondary | ICD-10-CM | POA: Diagnosis not present

## 2020-03-26 DIAGNOSIS — J320 Chronic maxillary sinusitis: Secondary | ICD-10-CM | POA: Diagnosis not present

## 2020-04-07 DIAGNOSIS — Z23 Encounter for immunization: Secondary | ICD-10-CM | POA: Diagnosis not present

## 2020-04-23 DIAGNOSIS — Z23 Encounter for immunization: Secondary | ICD-10-CM | POA: Diagnosis not present

## 2020-06-30 DIAGNOSIS — H524 Presbyopia: Secondary | ICD-10-CM | POA: Diagnosis not present

## 2020-06-30 DIAGNOSIS — H401132 Primary open-angle glaucoma, bilateral, moderate stage: Secondary | ICD-10-CM | POA: Diagnosis not present

## 2020-07-14 DIAGNOSIS — Z1231 Encounter for screening mammogram for malignant neoplasm of breast: Secondary | ICD-10-CM | POA: Diagnosis not present

## 2020-10-28 DIAGNOSIS — D1801 Hemangioma of skin and subcutaneous tissue: Secondary | ICD-10-CM | POA: Diagnosis not present

## 2020-10-28 DIAGNOSIS — L578 Other skin changes due to chronic exposure to nonionizing radiation: Secondary | ICD-10-CM | POA: Diagnosis not present

## 2020-10-28 DIAGNOSIS — L814 Other melanin hyperpigmentation: Secondary | ICD-10-CM | POA: Diagnosis not present

## 2020-10-28 DIAGNOSIS — L570 Actinic keratosis: Secondary | ICD-10-CM | POA: Diagnosis not present

## 2020-10-28 DIAGNOSIS — L821 Other seborrheic keratosis: Secondary | ICD-10-CM | POA: Diagnosis not present

## 2020-11-04 ENCOUNTER — Ambulatory Visit
Admission: RE | Admit: 2020-11-04 | Discharge: 2020-11-04 | Disposition: A | Discharge status: Home / Self Care | Payer: Medicare Other | Source: Ambulatory Visit | Attending: Internal Medicine | Admitting: Internal Medicine

## 2020-11-04 ENCOUNTER — Other Ambulatory Visit: Payer: Self-pay | Admitting: Registered Nurse

## 2020-11-04 ENCOUNTER — Other Ambulatory Visit: Payer: Self-pay | Admitting: Internal Medicine

## 2020-11-04 DIAGNOSIS — N281 Cyst of kidney, acquired: Secondary | ICD-10-CM

## 2020-11-04 DIAGNOSIS — R1032 Left lower quadrant pain: Secondary | ICD-10-CM | POA: Diagnosis not present

## 2020-11-04 DIAGNOSIS — K5732 Diverticulitis of large intestine without perforation or abscess without bleeding: Secondary | ICD-10-CM | POA: Diagnosis not present

## 2020-11-04 DIAGNOSIS — K5792 Diverticulitis of intestine, part unspecified, without perforation or abscess without bleeding: Secondary | ICD-10-CM | POA: Diagnosis not present

## 2020-11-04 DIAGNOSIS — M419 Scoliosis, unspecified: Secondary | ICD-10-CM | POA: Diagnosis not present

## 2020-11-04 DIAGNOSIS — R509 Fever, unspecified: Secondary | ICD-10-CM | POA: Diagnosis not present

## 2020-11-04 MED ORDER — IOPAMIDOL (ISOVUE-300) INJECTION 61%
100.0000 mL | Freq: Once | INTRAVENOUS | Status: AC | PRN
  Administered 2020-11-04: 100 mL via INTRAVENOUS

## 2020-11-05 ENCOUNTER — Ambulatory Visit
Admission: RE | Admit: 2020-11-05 | Discharge: 2020-11-05 | Disposition: A | Discharge status: Home / Self Care | Payer: Medicare Other | Source: Ambulatory Visit | Attending: Registered Nurse | Admitting: Registered Nurse

## 2020-11-05 DIAGNOSIS — N281 Cyst of kidney, acquired: Secondary | ICD-10-CM

## 2020-11-17 DIAGNOSIS — K5732 Diverticulitis of large intestine without perforation or abscess without bleeding: Secondary | ICD-10-CM | POA: Diagnosis not present

## 2020-12-11 DIAGNOSIS — H401131 Primary open-angle glaucoma, bilateral, mild stage: Secondary | ICD-10-CM | POA: Diagnosis not present

## 2021-01-09 DIAGNOSIS — E785 Hyperlipidemia, unspecified: Secondary | ICD-10-CM | POA: Diagnosis not present

## 2021-01-09 DIAGNOSIS — M810 Age-related osteoporosis without current pathological fracture: Secondary | ICD-10-CM | POA: Diagnosis not present

## 2021-01-14 DIAGNOSIS — R82998 Other abnormal findings in urine: Secondary | ICD-10-CM | POA: Diagnosis not present

## 2021-01-16 DIAGNOSIS — M419 Scoliosis, unspecified: Secondary | ICD-10-CM | POA: Diagnosis not present

## 2021-01-16 DIAGNOSIS — E559 Vitamin D deficiency, unspecified: Secondary | ICD-10-CM | POA: Diagnosis not present

## 2021-01-16 DIAGNOSIS — J302 Other seasonal allergic rhinitis: Secondary | ICD-10-CM | POA: Diagnosis not present

## 2021-01-16 DIAGNOSIS — M810 Age-related osteoporosis without current pathological fracture: Secondary | ICD-10-CM | POA: Diagnosis not present

## 2021-01-16 DIAGNOSIS — Z0000 Encounter for general adult medical examination without abnormal findings: Secondary | ICD-10-CM | POA: Diagnosis not present

## 2021-01-16 DIAGNOSIS — J45909 Unspecified asthma, uncomplicated: Secondary | ICD-10-CM | POA: Diagnosis not present

## 2021-01-16 DIAGNOSIS — R030 Elevated blood-pressure reading, without diagnosis of hypertension: Secondary | ICD-10-CM | POA: Diagnosis not present

## 2021-01-16 DIAGNOSIS — E785 Hyperlipidemia, unspecified: Secondary | ICD-10-CM | POA: Diagnosis not present

## 2021-03-20 DIAGNOSIS — D485 Neoplasm of uncertain behavior of skin: Secondary | ICD-10-CM | POA: Diagnosis not present

## 2021-03-20 DIAGNOSIS — L570 Actinic keratosis: Secondary | ICD-10-CM | POA: Diagnosis not present

## 2021-05-09 DIAGNOSIS — Z23 Encounter for immunization: Secondary | ICD-10-CM | POA: Diagnosis not present

## 2021-07-05 IMAGING — CT CT ABD-PELV W/ CM
1 of 3 series · 13 of 32 positions shown, 19 images · IV contrast (APPLIED)
Comparison: None.

CLINICAL DATA: Left lower quadrant pain for 2-3 days. History of
diverticulitis.

EXAM:
CT ABDOMEN AND PELVIS WITH CONTRAST
TECHNIQUE: Multidetector CT imaging of the abdomen and pelvis was performed
using the standard protocol following bolus administration of
intravenous contrast.
CONTRAST:  100 mL 3FTNO9-BWW IOPAMIDOL (3FTNO9-BWW) INJECTION 61%

[Series 2: abd/pelvis w/cm · axial · 0.79mm/px · z∈[-370,-20]mm · 13 of 82 slices shown, 19 images]
[im 6/82  soft-tissue]
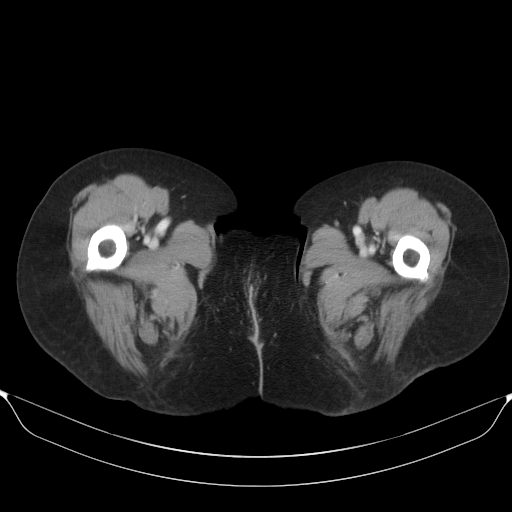
[im 6/82  bone]
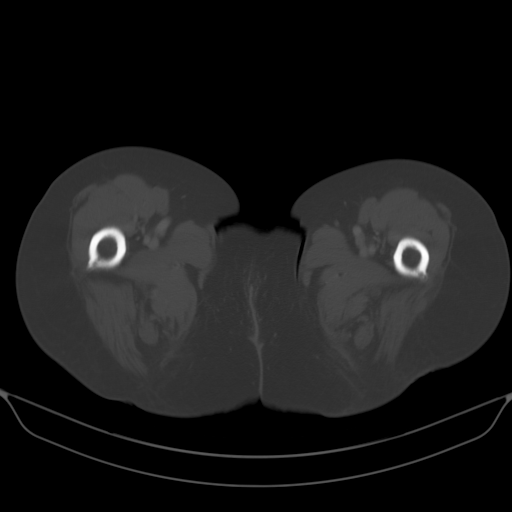
[im 12/82  soft-tissue]
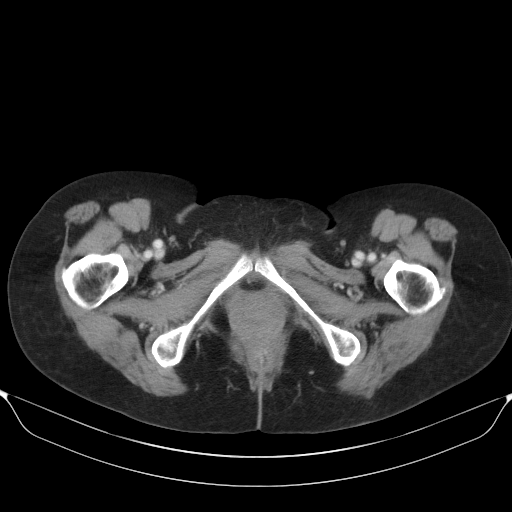
[im 18/82  soft-tissue]
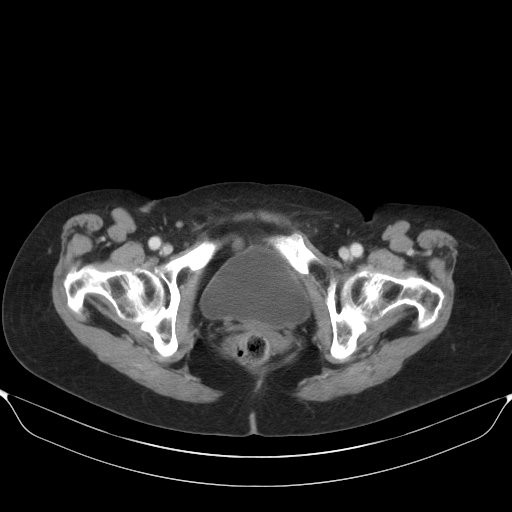
[im 24/82  soft-tissue]
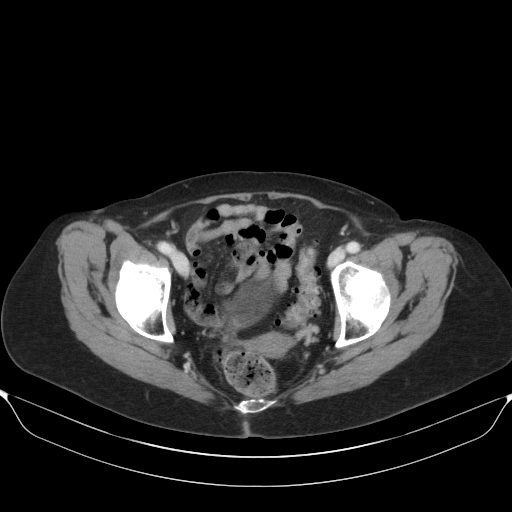
[im 29/82  soft-tissue]
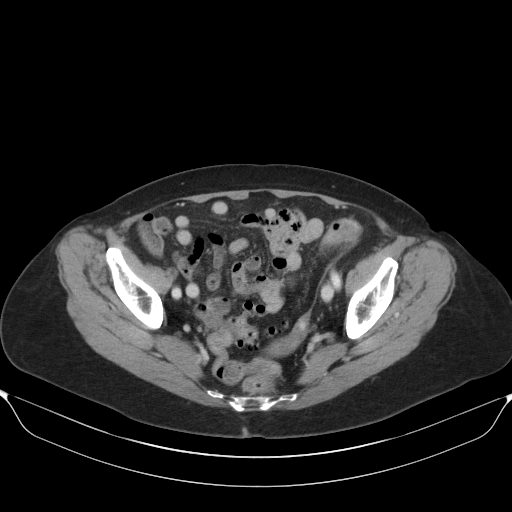
[im 35/82  soft-tissue]
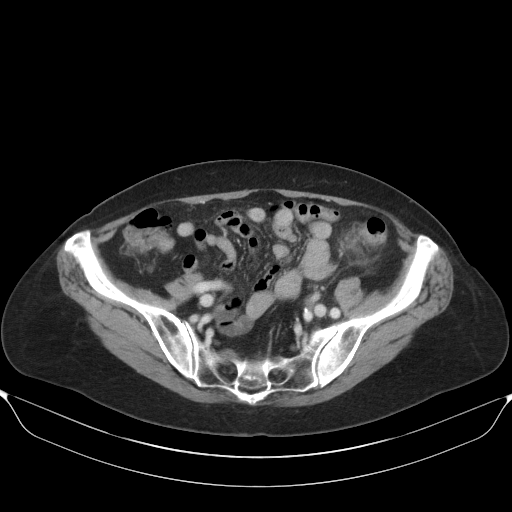
[im 41/82  soft-tissue]
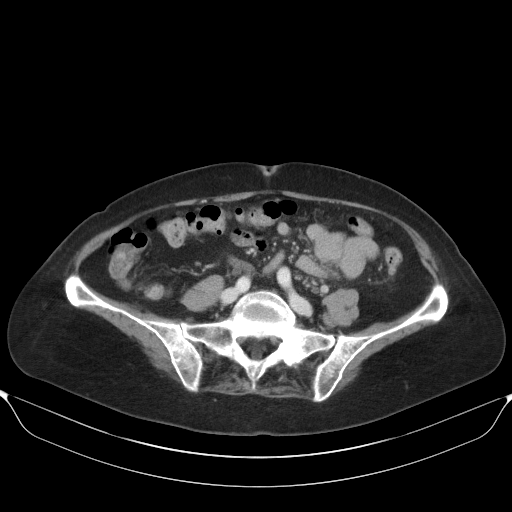
[im 47/82  soft-tissue]
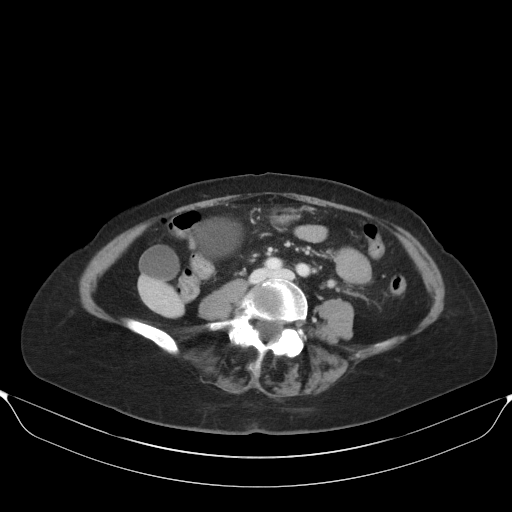
[im 53/82  soft-tissue]
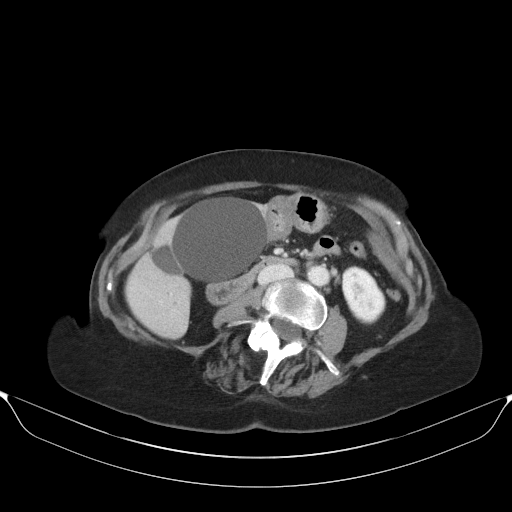
[im 53/82  bone]
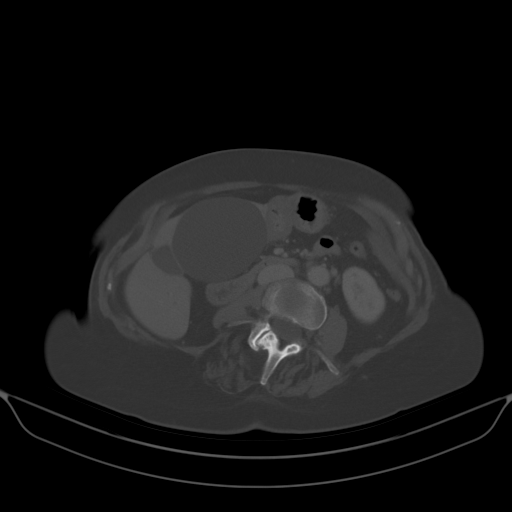
[im 58/82  soft-tissue]
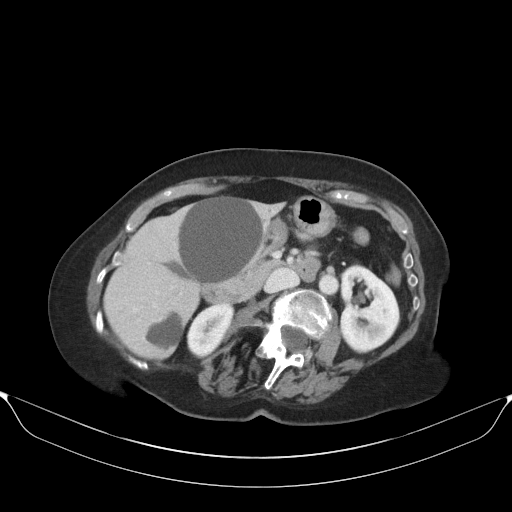
[im 58/82  lung]
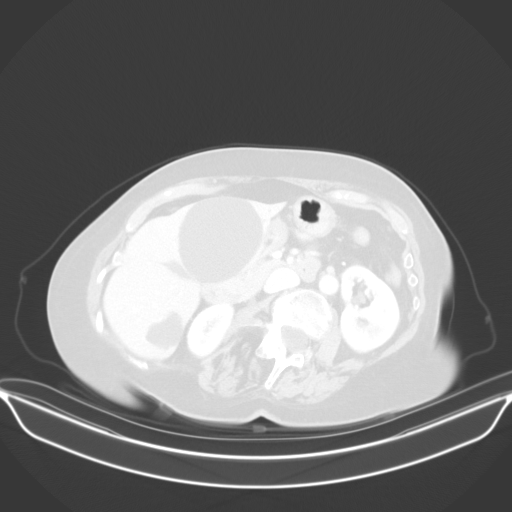
[im 64/82  soft-tissue]
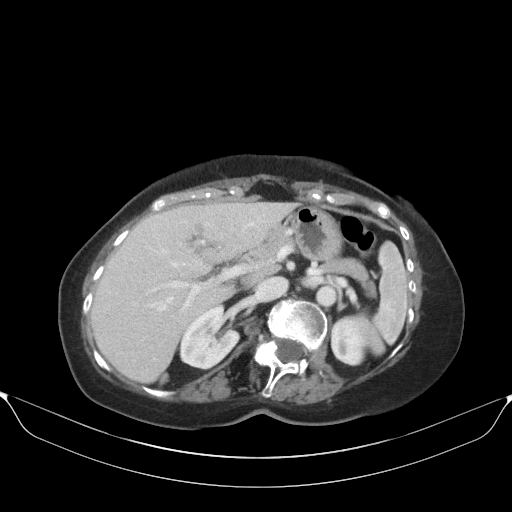
[im 64/82  lung]
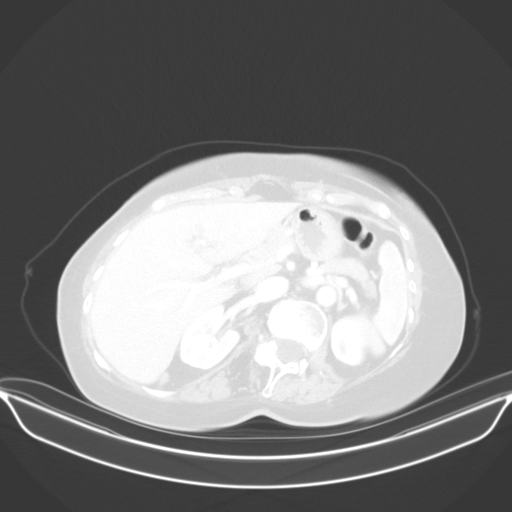
[im 70/82  soft-tissue]
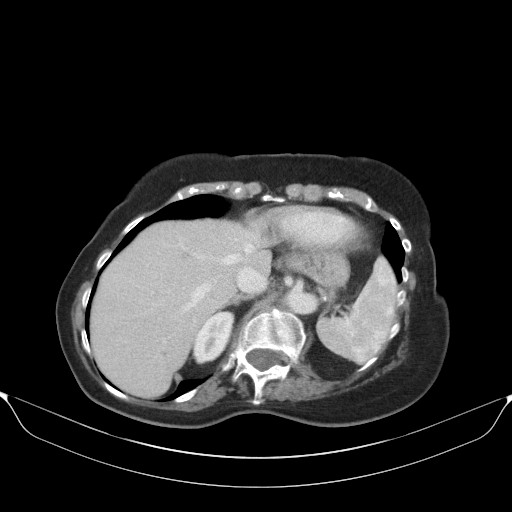
[im 70/82  lung]
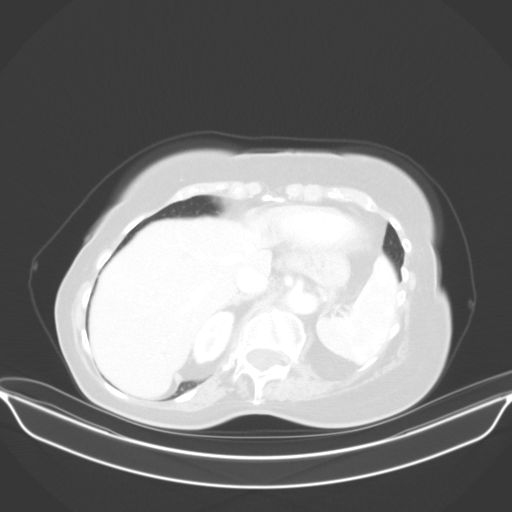
[im 76/82  soft-tissue]
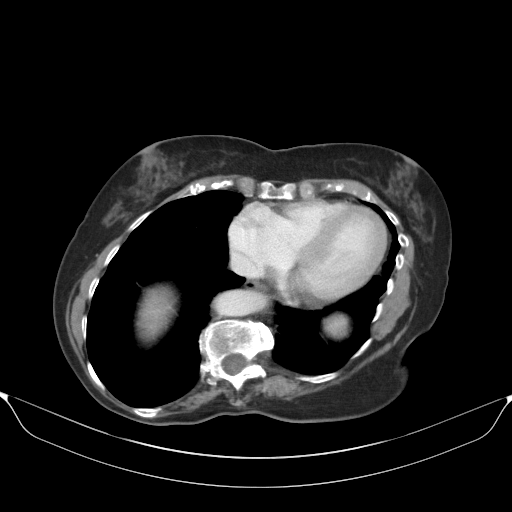
[im 76/82  lung]
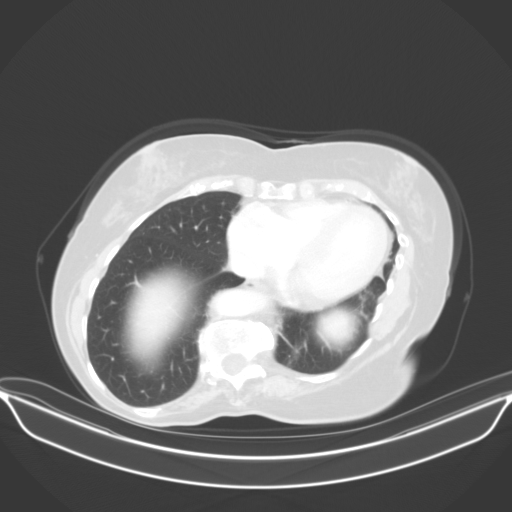

[13 of 32 positions shown; findings below may reference images not displayed]

FINDINGS: Lower chest: Lung bases clear.  No pleural or pericardial effusion.

Hepatobiliary: 3 low attenuating lesions in the liver are consistent
with cysts. The largest single cyst measures 6.5 cm AP 7.3 cm
transverse. No worrisome liver lesion is seen. The gallbladder and
biliary tree negative.

Pancreas: Unremarkable. No pancreatic ductal dilatation or
surrounding inflammatory changes.

Spleen: Normal in size without focal abnormality.

Adrenals/Urinary Tract: The adrenal glands appear normal. A 2 cm
cyst is seen in the lower pole of the left kidney. The kidneys are
otherwise normal appearance. Ureters and urinary bladder appear
normal.

Stomach/Bowel: The patient has sigmoid diverticulosis. There is wall
thickening and stranding about the proximal sigmoid colon consistent
with acute diverticulitis. No abscess or perforation. The stomach,
small bowel and appendix appear normal.

Vascular/Lymphatic: No significant vascular findings are present. No
enlarged abdominal or pelvic lymph nodes.

Reproductive: Uterus and bilateral adnexa are unremarkable.

Other: None.

Musculoskeletal: No acute abnormality. Severe convex left
thoracolumbar scoliosis is noted. No focal bony lesion.
IMPRESSION: The examination is positive for acute sigmoid diverticulitis without
abscess or perforation.

Severe convex left scoliosis.

## 2021-07-06 IMAGING — US US RENAL
1 series · 14 of 25 positions shown · non-contrast
Comparison: CT 11/04/2020

CLINICAL DATA: Left renal cyst

EXAM:
RENAL / URINARY TRACT ULTRASOUND COMPLETE

[Series 1: us renal · 0.23mm/px · 14 of 42 slices shown]
[im 1/42]
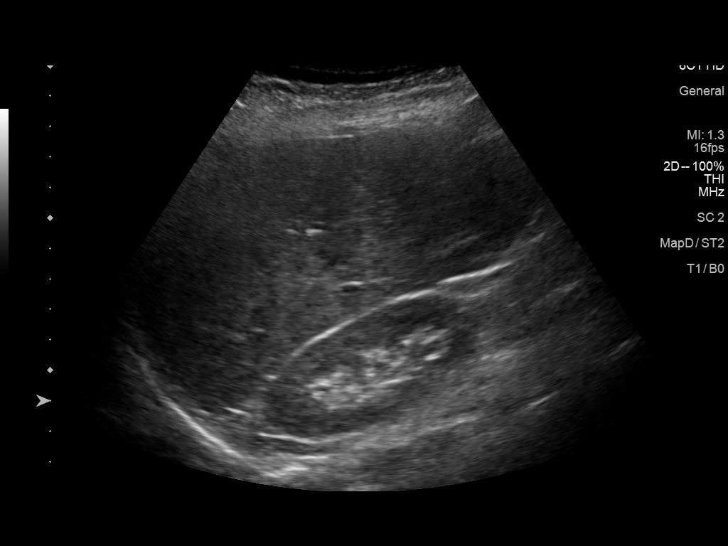
[im 4/42]
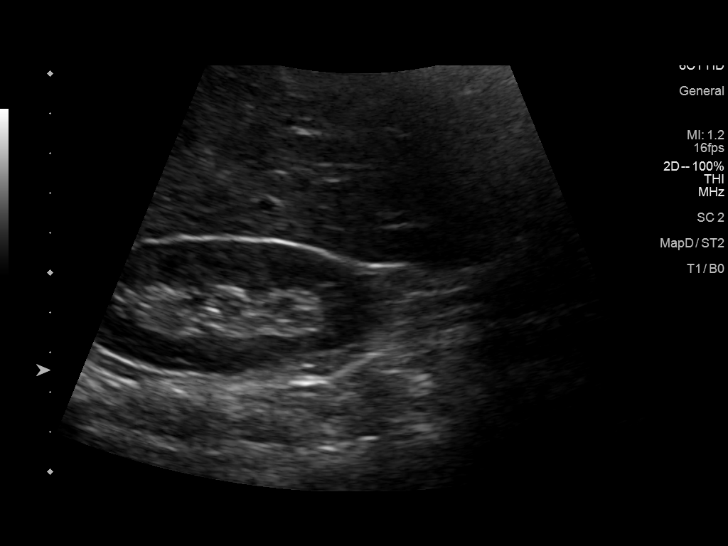
[im 7/42]
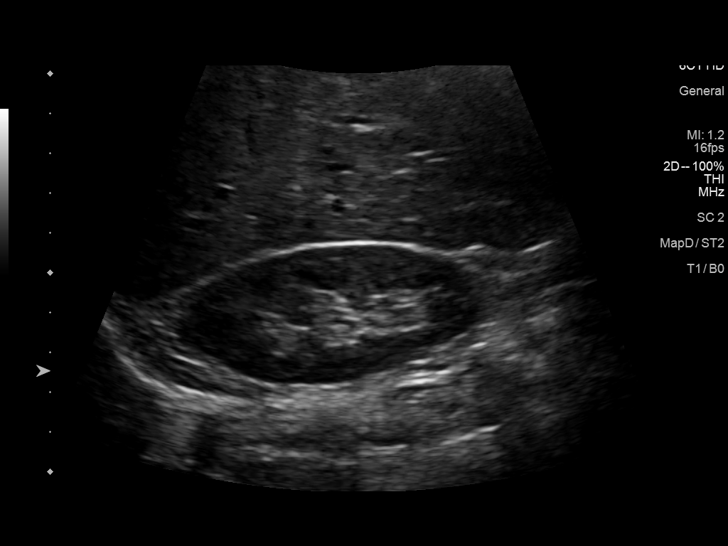
[im 11/42]
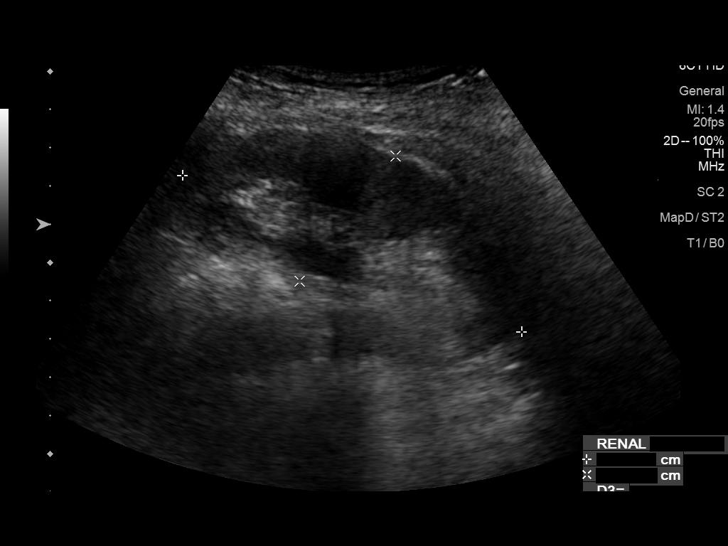
[im 14/42]
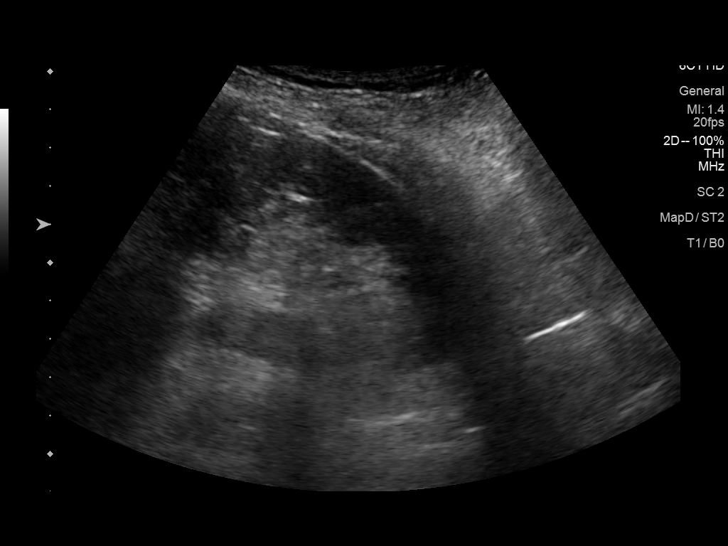
[im 16/42]
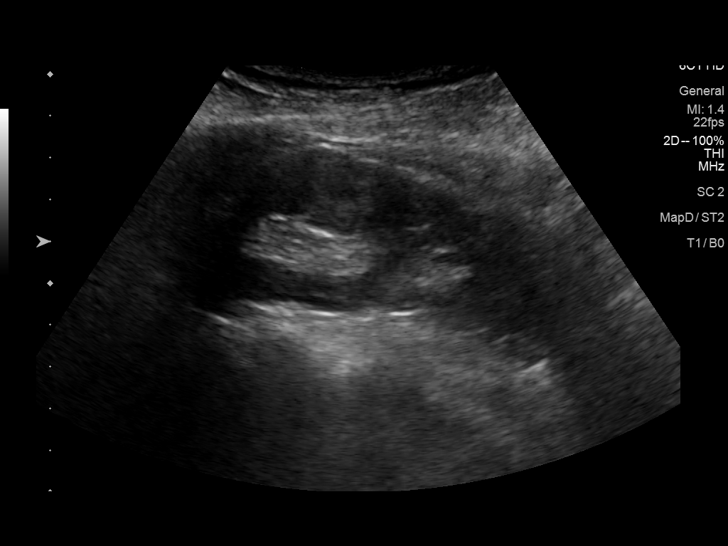
[im 19/42]
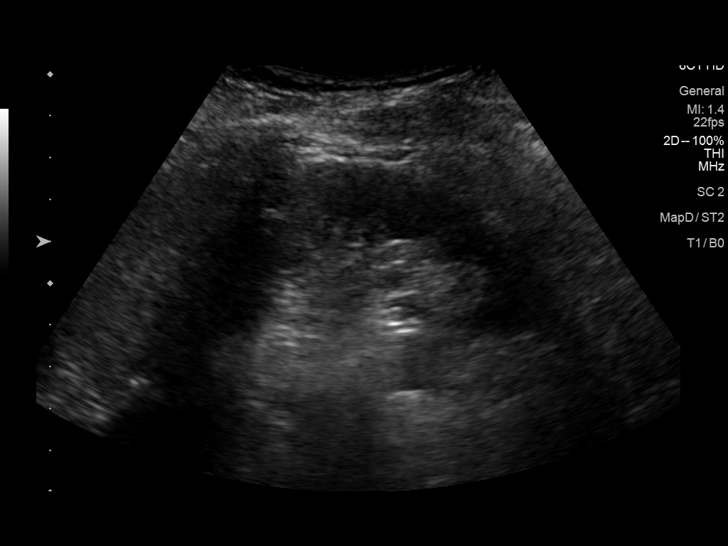
[im 23/42]
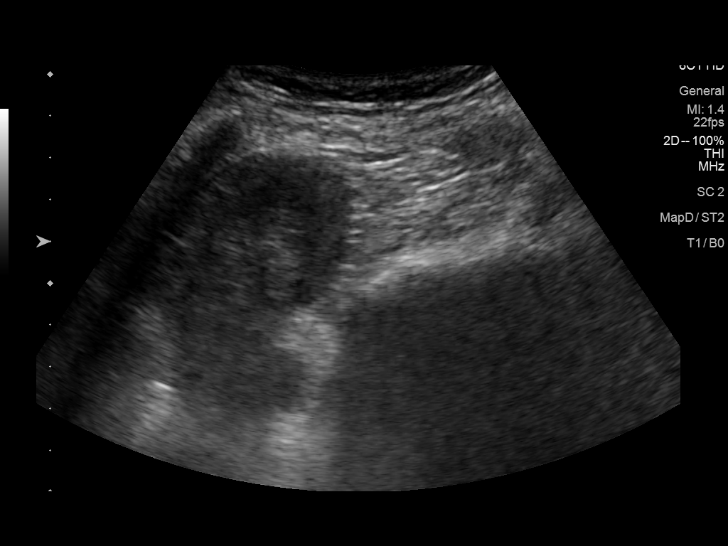
[im 26/42]
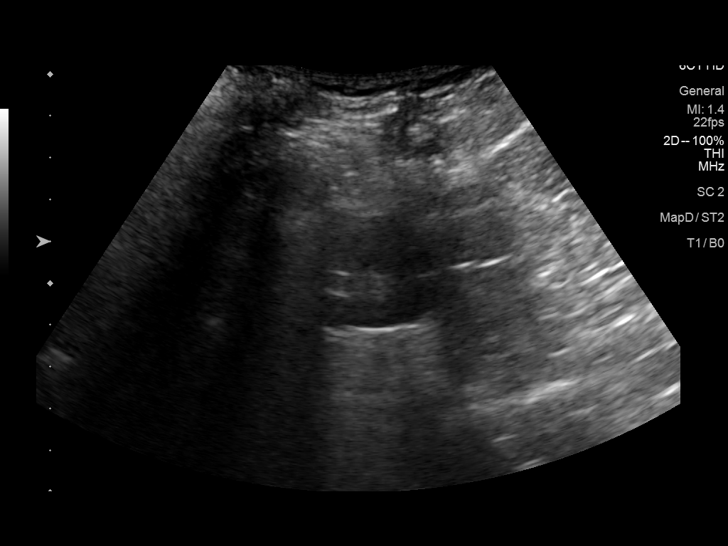
[im 28/42]
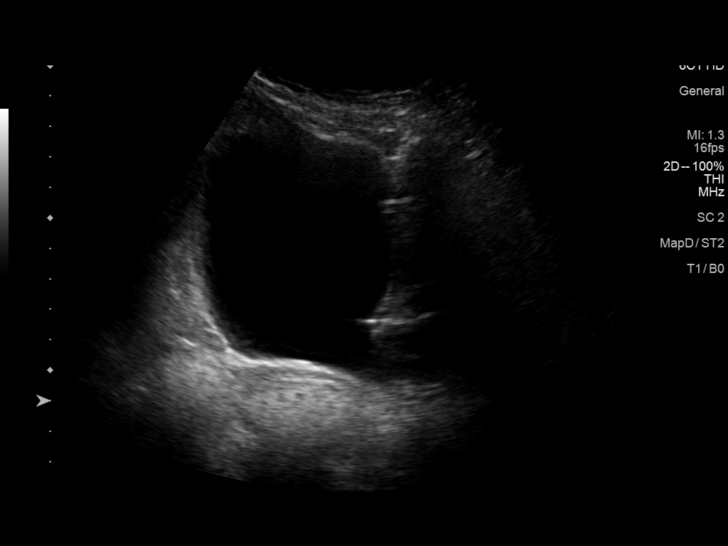
[im 31/42]
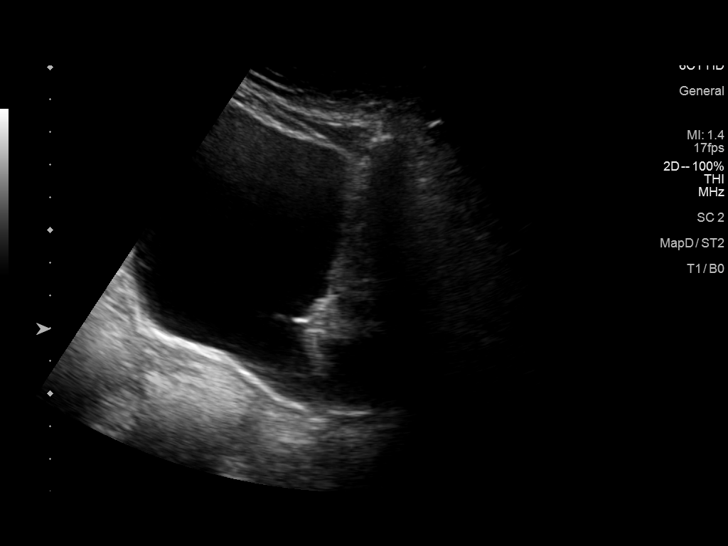
[im 35/42]
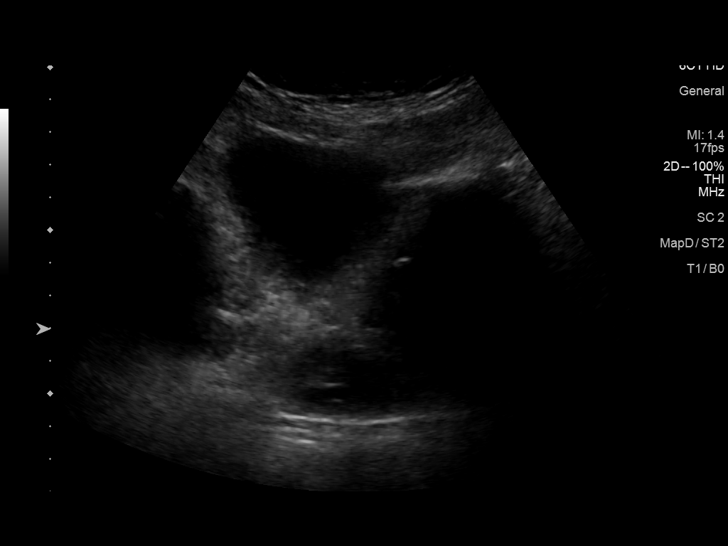
[im 38/42]
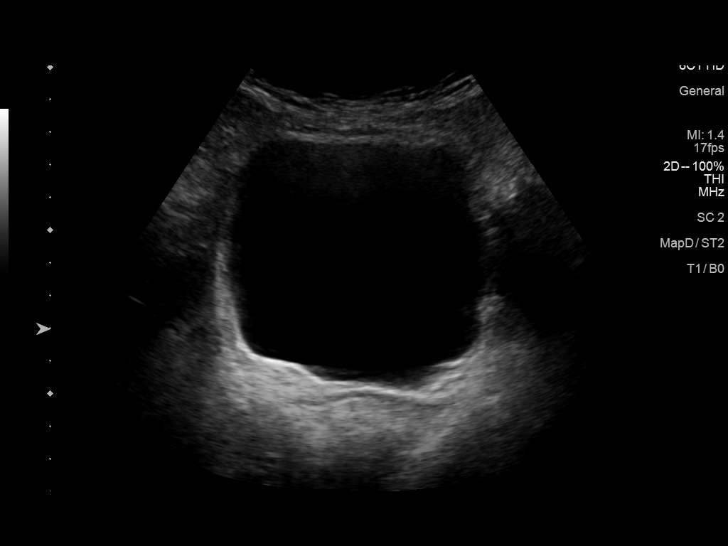
[im 42/42]
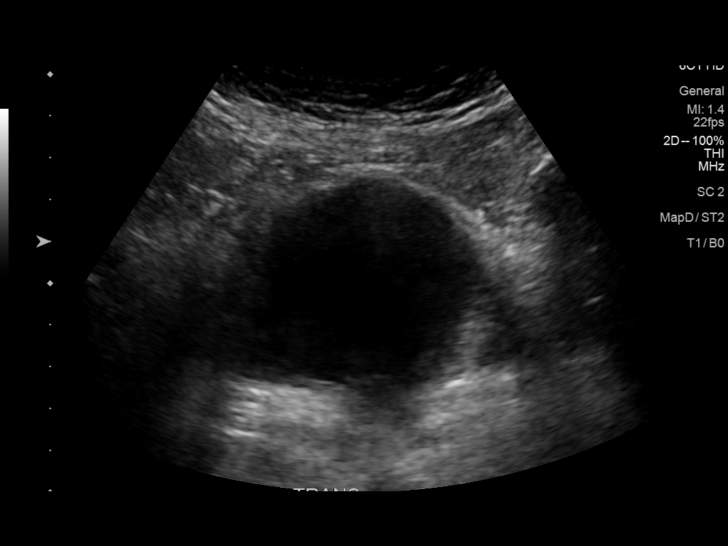

[14 of 25 positions shown; findings below may reference images not displayed]

FINDINGS: Right Kidney:

Renal measurements: 8.1 x 3.3 x 3.2 cm = volume: 45 mL. Echogenicity
within normal limits. No mass, shadowing stone, or hydronephrosis
visualized.

Left Kidney:

Renal measurements: 9.7 x 4.1 x 5.1 cm = volume: 108 mL.
Echogenicity within normal limits. Simple 1.9 cm midpole left renal
cyst. No shadowing stone or hydronephrosis visualized.

Bladder:

Appears normal for degree of bladder distention. Proximal urethra
appears mildly dilated.

Other:

None.
IMPRESSION: 1. Simple 1.9 cm left renal cyst.
2. Otherwise unremarkable sonographic appearance of the bilateral
kidneys.
3. Proximal urethra appears mildly dilated.

## 2021-07-22 DIAGNOSIS — Z23 Encounter for immunization: Secondary | ICD-10-CM | POA: Diagnosis not present

## 2021-08-05 DIAGNOSIS — J321 Chronic frontal sinusitis: Secondary | ICD-10-CM | POA: Diagnosis not present

## 2021-08-05 DIAGNOSIS — R430 Anosmia: Secondary | ICD-10-CM | POA: Diagnosis not present

## 2021-08-05 DIAGNOSIS — J322 Chronic ethmoidal sinusitis: Secondary | ICD-10-CM | POA: Diagnosis not present

## 2021-08-10 DIAGNOSIS — H401131 Primary open-angle glaucoma, bilateral, mild stage: Secondary | ICD-10-CM | POA: Diagnosis not present

## 2021-09-07 NOTE — Progress Notes (Deleted)
76 y.o. G88P1001 Married White or Caucasian Not Hispanic or Latino female here for annual exam.   ?  ? ?Patient's last menstrual period was 07/12/2000.          ?Sexually active: {yes no:314532}  ?The current method of family planning is {contraception:315051}.    ?Exercising: {yes no:314532}  {types:19826} ?Smoker:  {YES NO:22349} ? ?Health Maintenance: ?Pap:08/29/2018 WNL   11-03-16 WNL  ?History of abnormal Pap:  no ?MMG:   04-01-17 WNL  *** ?BMD: 2016 - Osteoporosis Dr. Sharlett Iles (primary), she declined medication  *** ?Colonoscopy: never*** ?TDaP:  04/23/13 ?Gardasil: n/a ? ? reports that she has never smoked. She has never used smokeless tobacco. She reports current alcohol use of about 3.0 - 4.0 standard drinks per week. She reports that she does not use drugs. ? ?Past Medical History:  ?Diagnosis Date  ? Abnormal uterine bleeding 1998  ? History of Clostridium difficile infection   ? Loss of smell   ? Loss of taste   ? Osteoporosis   ? Scoliosis   ? ? ?Past Surgical History:  ?Procedure Laterality Date  ? DILATATION & CURETTAGE/HYSTEROSCOPY WITH MYOSURE N/A 09/11/2018  ? Procedure: DILATATION & CURETTAGE/HYSTEROSCOPY WITH MYOSURE;  Surgeon: Salvadore Dom, MD;  Location: [facility];  Service: Gynecology;  Laterality: N/A;  ? NASAL SINUS SURGERY  2013  ? ? ?Current Outpatient Medications  ?Medication Sig Dispense Refill  ? BUDESONIDE NA Place into the nose daily.    ? multivitamin-lutein (OCUVITE-LUTEIN) CAPS capsule Take 1 capsule by mouth daily.    ? Probiotic Product (PROBIOTIC-10 PO) Take by mouth.    ? Vitamin D, Ergocalciferol, (DRISDOL) 50000 UNITS CAPS capsule Take 50,000 Units by mouth every 7 (seven) days.   4  ? ?No current facility-administered medications for this visit.  ? ? ?Family History  ?Problem Relation Age of Onset  ? Alzheimer's disease Mother   ? Heart failure Father   ? Hyperlipidemia Father   ? Heart disease Brother   ? Glaucoma Brother   ? ? ?Review of  Systems ? ?Exam:   ?LMP 07/12/2000   Weight change: @WEIGHTCHANGE @ Height:      ?Ht Readings from Last 3 Encounters:  ?09/25/18 5' 1.25" (1.556 m)  ?09/11/18 5' 1.25" (1.556 m)  ?09/05/18 5' 1.25" (1.556 m)  ? ? ?General appearance: alert, cooperative and appears stated age ?Head: Normocephalic, without obvious abnormality, atraumatic ?Neck: no adenopathy, supple, symmetrical, trachea midline and thyroid {CHL AMB PHY EX THYROID NORM DEFAULT:[id number]::"normal to inspection and palpation"} ?Lungs: clear to auscultation bilaterally ?Cardiovascular: regular rate and rhythm ?Breasts: {Exam; breast:13139::"normal appearance, no masses or tenderness"} ?Abdomen: soft, non-tender; non distended,  no masses,  no organomegaly ?Extremities: extremities normal, atraumatic, no cyanosis or edema ?Skin: Skin color, texture, turgor normal. No rashes or lesions ?Lymph nodes: Cervical, supraclavicular, and axillary nodes normal. ?No abnormal inguinal nodes palpated ?Neurologic: Grossly normal ? ? ?Pelvic: External genitalia:  no lesions ?             Urethra:  normal appearing urethra with no masses, tenderness or lesions ?             Bartholins and Skenes: normal    ?             Vagina: normal appearing vagina with normal color and discharge, no lesions ?             Cervix: {CHL AMB PHY EX CERVIX NORM [phone]::"no lesions"} ?              ?  Bimanual Exam:  Uterus:  {CHL AMB PHY EX UTERUS NORM DEFAULT:[phone]::"normal size, contour, position, consistency, mobility, non-tender"} ?             Adnexa: {CHL AMB PHY EX ADNEXA NO MASS DEFAULT:[phone]::"no mass, fullness, tenderness"} ?              Rectovaginal: Confirms ?              Anus:  normal sphincter tone, no lesions ? ?*** chaperoned for the exam. ? ?A:  Well Woman with normal exam ? ?P:    ? ? ? ?

## 2021-09-15 ENCOUNTER — Ambulatory Visit: Payer: Medicare Other | Admitting: Obstetrics and Gynecology

## 2022-01-21 DIAGNOSIS — E785 Hyperlipidemia, unspecified: Secondary | ICD-10-CM | POA: Diagnosis not present

## 2022-01-21 DIAGNOSIS — E559 Vitamin D deficiency, unspecified: Secondary | ICD-10-CM | POA: Diagnosis not present

## 2022-01-21 DIAGNOSIS — R7989 Other specified abnormal findings of blood chemistry: Secondary | ICD-10-CM | POA: Diagnosis not present

## 2022-01-25 DIAGNOSIS — H401131 Primary open-angle glaucoma, bilateral, mild stage: Secondary | ICD-10-CM | POA: Diagnosis not present

## 2022-01-25 DIAGNOSIS — H5203 Hypermetropia, bilateral: Secondary | ICD-10-CM | POA: Diagnosis not present

## 2022-02-09 DIAGNOSIS — E559 Vitamin D deficiency, unspecified: Secondary | ICD-10-CM | POA: Diagnosis not present

## 2022-02-09 DIAGNOSIS — Z1331 Encounter for screening for depression: Secondary | ICD-10-CM | POA: Diagnosis not present

## 2022-02-09 DIAGNOSIS — E785 Hyperlipidemia, unspecified: Secondary | ICD-10-CM | POA: Diagnosis not present

## 2022-02-09 DIAGNOSIS — H903 Sensorineural hearing loss, bilateral: Secondary | ICD-10-CM | POA: Diagnosis not present

## 2022-02-09 DIAGNOSIS — J302 Other seasonal allergic rhinitis: Secondary | ICD-10-CM | POA: Diagnosis not present

## 2022-02-09 DIAGNOSIS — R82998 Other abnormal findings in urine: Secondary | ICD-10-CM | POA: Diagnosis not present

## 2022-02-09 DIAGNOSIS — I351 Nonrheumatic aortic (valve) insufficiency: Secondary | ICD-10-CM | POA: Diagnosis not present

## 2022-02-09 DIAGNOSIS — M810 Age-related osteoporosis without current pathological fracture: Secondary | ICD-10-CM | POA: Diagnosis not present

## 2022-02-09 DIAGNOSIS — M419 Scoliosis, unspecified: Secondary | ICD-10-CM | POA: Diagnosis not present

## 2022-02-09 DIAGNOSIS — Z0000 Encounter for general adult medical examination without abnormal findings: Secondary | ICD-10-CM | POA: Diagnosis not present

## 2022-02-09 DIAGNOSIS — Z1339 Encounter for screening examination for other mental health and behavioral disorders: Secondary | ICD-10-CM | POA: Diagnosis not present

## 2022-02-09 DIAGNOSIS — J45909 Unspecified asthma, uncomplicated: Secondary | ICD-10-CM | POA: Diagnosis not present

## 2022-02-09 DIAGNOSIS — H9313 Tinnitus, bilateral: Secondary | ICD-10-CM | POA: Diagnosis not present

## 2022-02-26 ENCOUNTER — Ambulatory Visit: Payer: Medicare Other | Admitting: Internal Medicine

## 2022-02-26 ENCOUNTER — Encounter: Payer: Self-pay | Admitting: Internal Medicine

## 2022-02-26 VITALS — BP 171/81 | HR 72 | Temp 98.0°F | Resp 16 | Ht 61.0 in | Wt 126.0 lb

## 2022-02-26 DIAGNOSIS — I10 Essential (primary) hypertension: Secondary | ICD-10-CM | POA: Diagnosis not present

## 2022-02-26 DIAGNOSIS — E782 Mixed hyperlipidemia: Secondary | ICD-10-CM | POA: Insufficient documentation

## 2022-02-26 DIAGNOSIS — I359 Nonrheumatic aortic valve disorder, unspecified: Secondary | ICD-10-CM | POA: Insufficient documentation

## 2022-02-26 MED ORDER — AMLODIPINE BESYLATE 2.5 MG PO TABS: 2.5000 mg | ORAL_TABLET | Freq: Every day | ORAL | 2 refills | Status: DC

## 2022-02-26 NOTE — Progress Notes (Signed)
Primary Physician/Referring:  Donnajean Lopes, MD  Patient ID: Kerry White, female    DOB: 12-30-1945, 76 y.o.   MRN: [id number]  Chief Complaint  Patient presents with   aortic valve    New Patient (Initial Visit)   HPI:    Kerry White  is a 76 y.o. female with past medical history significant for hypertension, hyperlipidemia, and osteoporosis who is here to establish care with cardiology.  Her primary care doctor sent her in after hearing a new murmur.  Patient denies chest pain, shortness of breath, palpitations, diaphoresis, syncope.  She does have very labile blood pressures according to her however her blood pressure is significantly elevated today.  Patient states she has side effects to every medication she takes.  She is reluctant to take something for blood pressure but is agreeable to trying a very low dose of amlodipine.  Patient instructed to call with side effects.  She will bring a blood pressure log to the next visit.  Past Medical History:  Diagnosis Date   Abnormal uterine bleeding 1998   History of Clostridium difficile infection    Loss of smell    Loss of taste    Osteoporosis    Scoliosis    Past Surgical History:  Procedure Laterality Date   DILATATION & CURETTAGE/HYSTEROSCOPY WITH MYOSURE N/A 09/11/2018   Procedure: DILATATION & CURETTAGE/HYSTEROSCOPY WITH MYOSURE;  Surgeon: Salvadore Dom, MD;  Location: [facility];  Service: Gynecology;  Laterality: N/A;   NASAL SINUS SURGERY  2013   Family History  Problem Relation Age of Onset   Alzheimer's disease Mother    Heart failure Father    Hyperlipidemia Father    Heart disease Brother    Glaucoma Brother     Social History   Tobacco Use   Smoking status: Never   Smokeless tobacco: Never  Substance Use Topics   Alcohol use: Yes    Alcohol/week: 3.0 - 4.0 standard drinks of alcohol    Types: 3 - 4 Glasses of wine per week    Comment: wine every evening   Marital  Status: Married  ROS  Review of Systems  Cardiovascular:  Positive for irregular heartbeat.  All other systems reviewed and are negative. Objective  Blood pressure (!) 171/81, pulse 72, temperature 98 F (36.7 C), resp. rate 16, height '5\' 1"'$  (1.549 m), weight 126 lb (57.2 kg), last menstrual period 07/12/2000, SpO2 98 %. Body mass index is 23.81 kg/m.     02/26/2022    8:47 AM 09/25/2018    9:59 AM 09/11/2018   10:00 AM  Vitals with BMI  Height '5\' 1"'$  5' 1.25"   Weight 126 lbs 126 lbs   BMI 56.81 27.51   Systolic 700 174 944  Diastolic 81 70 58  Pulse 72 76 65     Physical Exam Vitals reviewed.  Constitutional:      Appearance: Normal appearance. She is normal weight.  HENT:     Head: Normocephalic and atraumatic.  Neck:     Vascular: No carotid bruit.  Cardiovascular:     Rate and Rhythm: Normal rate and regular rhythm.     Pulses: Normal pulses.     Heart sounds: Murmur heard.  Pulmonary:     Effort: Pulmonary effort is normal.     Breath sounds: Normal breath sounds.  Abdominal:     General: Abdomen is flat. Bowel sounds are normal.     Palpations: Abdomen is soft.  Musculoskeletal:  Right lower leg: No edema.     Left lower leg: No edema.  Skin:    General: Skin is warm and dry.  Neurological:     Mental Status: She is alert.   Medications and allergies   Allergies  Allergen Reactions   Clindamycin/Lincomycin Diarrhea   Ibuprofen     cough   Macrobid [Nitrofurantoin Macrocrystal] Nausea Only and Other (See Comments)    Headache, dizziness   Penicillins    Sulfa Antibiotics      Medication list after today's encounter   Current Outpatient Medications:    amLODipine (NORVASC) 2.5 MG tablet, Take 1 tablet (2.5 mg total) by mouth at bedtime., Disp: 30 tablet, Rfl: 2   multivitamin-lutein (OCUVITE-LUTEIN) CAPS capsule, Take 1 capsule by mouth daily., Disp: , Rfl:    Probiotic Product (PROBIOTIC-10 PO), Take by mouth., Disp: , Rfl:    Vitamin D,  Ergocalciferol, (DRISDOL) 50000 UNITS CAPS capsule, Take 50,000 Units by mouth every 7 (seven) days. , Disp: , Rfl: 4   ZIOPTAN 0.0015 % SOLN, Apply 1 drop to eye at bedtime., Disp: , Rfl:   Laboratory examination:   Lab Results  Component Value Date   NA 141 09/11/2018   K 3.5 09/11/2018   CO2 28 09/11/2018   GLUCOSE 97 09/11/2018   BUN 16 09/11/2018   CREATININE 0.83 09/11/2018   CALCIUM 9.2 09/11/2018   GFRNONAA >60 09/11/2018       Latest Ref Rng & Units 09/11/2018    6:32 AM  CMP  Glucose 70 - 99 mg/dL 97   BUN 8 - 23 mg/dL 16   Creatinine 0.44 - 1.00 mg/dL 0.83   Sodium 135 - 145 mmol/L 141   Potassium 3.5 - 5.1 mmol/L 3.5   Chloride 98 - 111 mmol/L 104   CO2 22 - 32 mmol/L 28   Calcium 8.9 - 10.3 mg/dL 9.2   Total Protein 6.5 - 8.1 g/dL 7.3   Total Bilirubin 0.3 - 1.2 mg/dL 1.5   Alkaline Phos 38 - 126 U/L 55   AST 15 - 41 U/L 23   ALT 0 - 44 U/L 20       Latest Ref Rng & Units 09/11/2018    6:32 AM  CBC  WBC 4.0 - 10.5 K/uL 5.1   Hemoglobin 12.0 - 15.0 g/dL 13.6   Hematocrit 36.0 - 46.0 % 42.8   Platelets 150 - 400 K/uL 229     Lipid Panel No results for input(s): "CHOL", "TRIG", "LDLCALC", "VLDL", "HDL", "CHOLHDL", "LDLDIRECT" in the last 8760 hours.  HEMOGLOBIN A1C No results found for: "HGBA1C", "MPG" TSH No results for input(s): "TSH" in the last 8760 hours.  External labs:     Radiology:    Cardiac Studies:   No results found for this or any previous visit from the past 1095 days.     No results found for this or any previous visit from the past 1095 days.     EKG:   02/26/22 NSR with LAE  Assessment     ICD-10-CM   1. Aortic valve disorder  I35.9 EKG 12-Lead    PCV ECHOCARDIOGRAM COMPLETE    2. Essential hypertension  I10     3. Mixed hyperlipidemia  E78.2        Orders Placed This Encounter  Procedures   EKG 12-Lead   PCV ECHOCARDIOGRAM COMPLETE    Standing Status:   Future    Standing Expiration Date:   02/27/2023  Meds ordered this encounter  Medications   amLODipine (NORVASC) 2.5 MG tablet    Sig: Take 1 tablet (2.5 mg total) by mouth at bedtime.    Dispense:  30 tablet    Refill:  2    Medications Discontinued During This Encounter  Medication Reason   BUDESONIDE NA      Recommendations:   Kerry White is a 76 y.o.  female presenting with a murmur  Will initiate amlodipine 2.'5mg'$  for HTN Patient to keep BP log for 1 week and bring to next visit Encourage low-sodium diet, less than 2000 mg daily. Schedule imaging tests in office - echo. Follow-up in 1-2 months or sooner if needed.     Floydene Flock, DO, [facility]  02/26/2022, 9:16 AM Office: [phone] Pager: [phone]

## 2022-03-05 ENCOUNTER — Ambulatory Visit: Payer: Medicare Other

## 2022-03-05 DIAGNOSIS — I359 Nonrheumatic aortic valve disorder, unspecified: Secondary | ICD-10-CM | POA: Diagnosis not present

## 2022-03-08 NOTE — Progress Notes (Signed)
//  Spoke with patient and advised her that her Echo was normal, patient wanted to know if her next scheduled appointment was necessary on 04/09/22

## 2022-03-08 NOTE — Progress Notes (Signed)
Yes. Its not normal she has aortic regurgitation but its fine since she doesn't have symptoms. I just want to discuss at visit. Thank you!

## 2022-03-08 NOTE — Progress Notes (Signed)
Can you please let patient know results, and we will just continue to monitor unless she develops worsening symptoms

## 2022-03-09 NOTE — Progress Notes (Signed)
Spoke with patient and advised her of Dr. Wende Crease message. Patient states she will keep appt for 04/09/22

## 2022-04-06 ENCOUNTER — Ambulatory Visit: Payer: Medicare Other | Admitting: Internal Medicine

## 2022-04-06 ENCOUNTER — Encounter: Payer: Self-pay | Admitting: Internal Medicine

## 2022-04-06 VITALS — BP 168/83 | HR 97 | Temp 98.2°F | Resp 16 | Ht 61.0 in | Wt 128.0 lb

## 2022-04-06 DIAGNOSIS — I10 Essential (primary) hypertension: Secondary | ICD-10-CM | POA: Diagnosis not present

## 2022-04-06 DIAGNOSIS — I351 Nonrheumatic aortic (valve) insufficiency: Secondary | ICD-10-CM | POA: Insufficient documentation

## 2022-04-06 DIAGNOSIS — E782 Mixed hyperlipidemia: Secondary | ICD-10-CM

## 2022-04-06 NOTE — Progress Notes (Signed)
Primary Physician/Referring:  Donnajean Lopes, MD  Patient ID: Kerry White, female    DOB: 1945/12/18, 76 y.o.   MRN: [id number]  No chief complaint on file.  HPI:    Kerry White  is a 76 y.o. female with past medical history significant for hypertension, hyperlipidemia, and osteoporosis who is here for a follow-up visit.  She has kept a blood pressure log since I saw her last, her blood pressure is pretty labile.  She does have multiple readings in the 140s however she is still reluctant to take blood pressure medication.  Patient is agreeable to enrolling in the blood pressure monitoring program at our office with our pharmacist.  Discussed the results of her echocardiogram and the moderate aortic regurgitation.  Patient remains asymptomatic and goes on walks with her husband and the dogs without any issue or shortness of breath.  She denies chest pain, palpitations, diaphoresis, syncope, orthopnea, PND, claudication.  Past Medical History:  Diagnosis Date   Abnormal uterine bleeding 1998   History of Clostridium difficile infection    Loss of smell    Loss of taste    Osteoporosis    Scoliosis    Past Surgical History:  Procedure Laterality Date   DILATATION & CURETTAGE/HYSTEROSCOPY WITH MYOSURE N/A 09/11/2018   Procedure: DILATATION & CURETTAGE/HYSTEROSCOPY WITH MYOSURE;  Surgeon: Salvadore Dom, MD;  Location: [facility];  Service: Gynecology;  Laterality: N/A;   NASAL SINUS SURGERY  2013   Family History  Problem Relation Age of Onset   Alzheimer's disease Mother    Heart failure Father    Hyperlipidemia Father    Heart disease Brother    Glaucoma Brother     Social History   Tobacco Use   Smoking status: Never   Smokeless tobacco: Never  Substance Use Topics   Alcohol use: Yes    Alcohol/week: 3.0 - 4.0 standard drinks of alcohol    Types: 3 - 4 Glasses of wine per week    Comment: wine every evening   Marital Status: Married  ROS   Review of Systems  Cardiovascular:  Negative for dyspnea on exertion, irregular heartbeat, near-syncope and palpitations.  Respiratory:  Negative for shortness of breath.   All other systems reviewed and are negative.  Objective  Blood pressure (!) 168/83, pulse 97, temperature 98.2 F (36.8 C), temperature source Temporal, resp. rate 16, height '5\' 1"'$  (1.549 m), weight 128 lb (58.1 kg), last menstrual period 07/12/2000, SpO2 97 %. Body mass index is 24.19 kg/m.     04/06/2022    2:31 PM 02/26/2022    8:47 AM 09/25/2018    9:59 AM  Vitals with BMI  Height '5\' 1"'$  '5\' 1"'$  5' 1.25"  Weight 128 lbs 126 lbs 126 lbs  BMI 24.2 93.81 82.99  Systolic 371 696 789  Diastolic 83 81 70  Pulse 97 72 76     Physical Exam Vitals and nursing note reviewed.  Constitutional:      Appearance: Normal appearance. She is normal weight.  HENT:     Head: Normocephalic and atraumatic.  Neck:     Vascular: No carotid bruit.  Cardiovascular:     Rate and Rhythm: Normal rate and regular rhythm.     Pulses: Normal pulses.     Heart sounds: Murmur heard.  Pulmonary:     Effort: Pulmonary effort is normal.     Breath sounds: Normal breath sounds.  Abdominal:     General: Bowel sounds are  normal.  Musculoskeletal:     Right lower leg: No edema.     Left lower leg: No edema.  Skin:    General: Skin is warm and dry.  Neurological:     Mental Status: She is alert.    Medications and allergies   Allergies  Allergen Reactions   Clindamycin/Lincomycin Diarrhea   Ibuprofen     cough   Macrobid [Nitrofurantoin Macrocrystal] Nausea Only and Other (See Comments)    Headache, dizziness   Penicillins    Sulfa Antibiotics      Medication list after today's encounter   Current Outpatient Medications:    amLODipine (NORVASC) 2.5 MG tablet, Take 1 tablet (2.5 mg total) by mouth at bedtime., Disp: 30 tablet, Rfl: 2   multivitamin-lutein (OCUVITE-LUTEIN) CAPS capsule, Take 1 capsule by mouth daily., Disp: ,  Rfl:    Probiotic Product (PROBIOTIC-10 PO), Take by mouth., Disp: , Rfl:    ZIOPTAN 0.0015 % SOLN, Apply 1 drop to eye at bedtime., Disp: , Rfl:    Vitamin D, Ergocalciferol, (DRISDOL) 50000 UNITS CAPS capsule, Take 50,000 Units by mouth every 7 (seven) days.  (Patient not taking: Reported on 04/06/2022), Disp: , Rfl: 4  Laboratory examination:   Lab Results  Component Value Date   NA 141 09/11/2018   K 3.5 09/11/2018   CO2 28 09/11/2018   GLUCOSE 97 09/11/2018   BUN 16 09/11/2018   CREATININE 0.83 09/11/2018   CALCIUM 9.2 09/11/2018   GFRNONAA >60 09/11/2018       Latest Ref Rng & Units 09/11/2018    6:32 AM  CMP  Glucose 70 - 99 mg/dL 97   BUN 8 - 23 mg/dL 16   Creatinine 0.44 - 1.00 mg/dL 0.83   Sodium 135 - 145 mmol/L 141   Potassium 3.5 - 5.1 mmol/L 3.5   Chloride 98 - 111 mmol/L 104   CO2 22 - 32 mmol/L 28   Calcium 8.9 - 10.3 mg/dL 9.2   Total Protein 6.5 - 8.1 g/dL 7.3   Total Bilirubin 0.3 - 1.2 mg/dL 1.5   Alkaline Phos 38 - 126 U/L 55   AST 15 - 41 U/L 23   ALT 0 - 44 U/L 20       Latest Ref Rng & Units 09/11/2018    6:32 AM  CBC  WBC 4.0 - 10.5 K/uL 5.1   Hemoglobin 12.0 - 15.0 g/dL 13.6   Hematocrit 36.0 - 46.0 % 42.8   Platelets 150 - 400 K/uL 229     Lipid Panel No results for input(s): "CHOL", "TRIG", "LDLCALC", "VLDL", "HDL", "CHOLHDL", "LDLDIRECT" in the last 8760 hours.  HEMOGLOBIN A1C No results found for: "HGBA1C", "MPG" TSH No results for input(s): "TSH" in the last 8760 hours.  External labs:     Radiology:    Cardiac Studies:        EKG:   02/26/22 NSR with LAE  Assessment     ICD-10-CM   1. Nonrheumatic aortic valve insufficiency  I35.1     2. Essential hypertension  I10     3. Mixed hyperlipidemia  E78.2        No orders of the defined types were placed in this encounter.   No orders of the defined types were placed in this encounter.   There are no discontinued medications.    Recommendations:    Kerry White is a 76 y.o.  female with past medical history significant for hypertension, hyperlipidemia, and osteoporosis who is  here for a follow-up visit.  She has kept a blood pressure log since I saw her last, her blood pressure is pretty labile.  She does have multiple readings in the 140s however she is still reluctant to take blood pressure medication.  Patient is agreeable to enrolling in the blood pressure monitoring program at our office with our pharmacist.  Discussed the results of her echocardiogram and the moderate aortic regurgitation.  Patient remains asymptomatic and goes on walks with her husband and the dogs without any issue or shortness of breath.  She denies chest pain, palpitations, diaphoresis, syncope, orthopnea, PND, claudication.  Encourage pt to take amlodipine 2.'5mg'$  for HTN Reviewed BP log with patient since last visit Enrolling in RPM blood pressure monitoring program with in-house pharmacist Encourage low-sodium diet, less than 2000 mg daily. Schedule imaging tests in office - repeat echo in 6 months. Follow-up in 6 months or sooner if needed.     Floydene Flock, DO, [facility]  04/06/2022, 2:59 PM Office: [phone] Pager: [phone]

## 2022-04-09 ENCOUNTER — Ambulatory Visit: Payer: Medicare Other | Admitting: Internal Medicine

## 2022-04-13 ENCOUNTER — Telehealth: Payer: Self-pay

## 2022-04-13 NOTE — Telephone Encounter (Signed)
Patient a recent start to RPM. Left patient a message for 1 week follow up. Will discuss compliance and continue to monitor.    Current BP Data: 04/11/2022 'Sunday at 02:25 PM 135 / 77      04/09/2022 Friday at 07:37 PM 133 / 77      04/08/2022 Thursday at 12:14 PM 141 / 77      04/06/2022 Tuesday at 02:58 PM 137 / 93      09'$ /26/2023 Tuesday at 02:49 PM 135 / 104

## 2022-04-16 DIAGNOSIS — I10 Essential (primary) hypertension: Secondary | ICD-10-CM | POA: Diagnosis not present

## 2022-04-24 DIAGNOSIS — Z23 Encounter for immunization: Secondary | ICD-10-CM | POA: Diagnosis not present

## 2022-04-30 DIAGNOSIS — H401131 Primary open-angle glaucoma, bilateral, mild stage: Secondary | ICD-10-CM | POA: Diagnosis not present

## 2022-05-17 DIAGNOSIS — I10 Essential (primary) hypertension: Secondary | ICD-10-CM | POA: Diagnosis not present

## 2022-06-01 ENCOUNTER — Telehealth: Payer: Self-pay

## 2022-06-01 NOTE — Telephone Encounter (Signed)
Spoke with patient today for RPM check-in, she is doing well. She would like to know how long she has to remain in RPM program. Patient was enrolled at the end of September and has since been compliant to meeting 16 days of readings each month. BP data below. Please advise.  BP Readings:  05/31/2022 Monday at 07:14 PM 128 / 71      05/30/2022 'Sunday at 07:13 PM 125 / 68      05/28/2022 Friday at 07:40 PM 129 / 73      05/27/2022 Thursday at 08:30 PM 144 / 82      05/27/2022 Thursday at 08:28 PM 149 / 86      05/25/2022 Tuesday at 07:42 PM 134 / 84      05/23/2022 Sunday at 04:06 PM 144 / 77      05/23/2022 Sunday at 04:01 PM 140 / 74      11'$ /04/2022 Friday at 03:14 PM 130 / 77  Average Systolic BP Level 147.09 mmHg Lowest Systolic BP Level 295 mmHg Highest Systolic BP Level 747 mmHg  Average Diastolic BP Level 34.03 mmHg Lowest Diastolic BP Level 68 mmHg Highest Diastolic BP Level 86 mmHg

## 2022-06-16 DIAGNOSIS — I10 Essential (primary) hypertension: Secondary | ICD-10-CM | POA: Diagnosis not present

## 2022-06-17 DIAGNOSIS — J358 Other chronic diseases of tonsils and adenoids: Secondary | ICD-10-CM | POA: Diagnosis not present

## 2022-06-17 DIAGNOSIS — J321 Chronic frontal sinusitis: Secondary | ICD-10-CM | POA: Diagnosis not present

## 2022-06-17 DIAGNOSIS — J322 Chronic ethmoidal sinusitis: Secondary | ICD-10-CM | POA: Diagnosis not present

## 2022-07-17 DIAGNOSIS — I10 Essential (primary) hypertension: Secondary | ICD-10-CM | POA: Diagnosis not present

## 2022-08-24 DIAGNOSIS — H401131 Primary open-angle glaucoma, bilateral, mild stage: Secondary | ICD-10-CM | POA: Diagnosis not present

## 2022-09-06 ENCOUNTER — Ambulatory Visit: Payer: Medicare Other

## 2022-09-06 ENCOUNTER — Telehealth: Payer: Self-pay

## 2022-09-06 DIAGNOSIS — E782 Mixed hyperlipidemia: Secondary | ICD-10-CM

## 2022-09-06 DIAGNOSIS — I351 Nonrheumatic aortic (valve) insufficiency: Secondary | ICD-10-CM

## 2022-09-06 DIAGNOSIS — I10 Essential (primary) hypertension: Secondary | ICD-10-CM

## 2022-09-06 NOTE — Progress Notes (Signed)
unchanged

## 2022-09-06 NOTE — Telephone Encounter (Signed)
error 

## 2022-09-06 NOTE — Telephone Encounter (Signed)
-----   Message from [place], DO sent at 09/06/2022 12:51 PM EST ----- unchanged

## 2022-09-06 NOTE — Progress Notes (Signed)
Called patient no answer, left a vm

## 2022-09-08 NOTE — Progress Notes (Signed)
Called and spoke with patient regarding her echocardiogram results.

## 2022-09-22 ENCOUNTER — Ambulatory Visit: Payer: Medicare Other | Admitting: Internal Medicine

## 2022-09-22 ENCOUNTER — Encounter: Payer: Self-pay | Admitting: Internal Medicine

## 2022-09-22 VITALS — BP 181/82 | HR 75 | Ht 61.0 in | Wt 123.8 lb

## 2022-09-22 DIAGNOSIS — I351 Nonrheumatic aortic (valve) insufficiency: Secondary | ICD-10-CM

## 2022-09-22 DIAGNOSIS — I10 Essential (primary) hypertension: Secondary | ICD-10-CM

## 2022-09-22 MED ORDER — AMLODIPINE BESYLATE 2.5 MG PO TABS: 2.5000 mg | ORAL_TABLET | Freq: Every day | ORAL | 3 refills | Status: DC

## 2022-09-22 NOTE — Progress Notes (Signed)
Primary Physician/Referring:  Donnajean Lopes, MD  Patient ID: Kerry White, female    DOB: Dec 06, 1945, 77 y.o.   MRN: [id number]  Chief Complaint  Patient presents with   Nonrheumatic aortic valve insufficiency   Follow-up   Results    HPI:    Kerry White  is a 77 y.o. female with past medical history significant for hypertension, hyperlipidemia, and osteoporosis who is here for a follow-up visit. Pt presents today for follow up appointment and noted to have high blood pressure, but otherwise feels "fine". She was enrolled in [employer] (now graduated), and during that time was having controlled blood pressures (reviewed below). She admits to not taking her prescribed amlodipine, and states she does not want to take any medication. She denies any other symptoms at this time.    Past Medical History:  Diagnosis Date   Abnormal uterine bleeding 1998   History of Clostridium difficile infection    Loss of smell    Loss of taste    Osteoporosis    Scoliosis    Past Surgical History:  Procedure Laterality Date   DILATATION & CURETTAGE/HYSTEROSCOPY WITH MYOSURE N/A 09/11/2018   Procedure: DILATATION & CURETTAGE/HYSTEROSCOPY WITH MYOSURE;  Surgeon: Salvadore Dom, MD;  Location: [facility];  Service: Gynecology;  Laterality: N/A;   NASAL SINUS SURGERY  2013   Family History  Problem Relation Age of Onset   Alzheimer's disease Mother    Heart failure Father    Hyperlipidemia Father    Heart disease Brother    Glaucoma Brother     Social History   Tobacco Use   Smoking status: Never   Smokeless tobacco: Never  Substance Use Topics   Alcohol use: Yes    Alcohol/week: 3.0 - 4.0 standard drinks of alcohol    Types: 3 - 4 Glasses of wine per week    Comment: wine every evening   Marital Status: Married  ROS  Review of Systems  Constitutional:  Negative for malaise/fatigue.  Respiratory:  Negative for cough and shortness of breath.   Cardiovascular:   Negative for chest pain, palpitations and leg swelling.  Neurological:  Negative for dizziness, weakness and headaches.   Objective  Blood pressure (!) 181/82, pulse 75, height '5\' 1"'$  (1.549 m), weight 123 lb 12.8 oz (56.2 kg), last menstrual period 07/12/2000, SpO2 98 %. Body mass index is 23.39 kg/m.     09/22/2022   11:26 AM 09/22/2022   11:22 AM 04/06/2022    2:31 PM  Vitals with BMI  Height  '5\' 1"'$  '5\' 1"'$   Weight  123 lbs 13 oz 128 lbs  BMI  0000000 [id number]  Systolic 0000000 0000000 [id number]  Diastolic 82 87 83  Pulse 75 70 97    Physical Exam Constitutional:      Appearance: Normal appearance.  Cardiovascular:     Rate and Rhythm: Normal rate.     Heart sounds: Murmur heard.  Pulmonary:     Effort: Pulmonary effort is normal.     Breath sounds: Normal breath sounds.  Abdominal:     General: Bowel sounds are normal.     Palpations: Abdomen is soft.  Skin:    General: Skin is warm and dry.  Neurological:     General: No focal deficit present.     Mental Status: She is alert.  Psychiatric:        Mood and Affect: Mood normal.       Medications and allergies  Allergies  Allergen Reactions   Clindamycin/Lincomycin Diarrhea   Ibuprofen     cough   Macrobid [Nitrofurantoin Macrocrystal] Nausea Only and Other (See Comments)    Headache, dizziness   Penicillins    Sulfa Antibiotics      Medication list after today's encounter   Current Outpatient Medications:    amLODipine (NORVASC) 2.5 MG tablet, Take 1 tablet (2.5 mg total) by mouth daily., Disp: 180 tablet, Rfl: 3   multivitamin-lutein (OCUVITE-LUTEIN) CAPS capsule, Take 1 capsule by mouth daily., Disp: , Rfl:    Probiotic Product (PROBIOTIC-10 PO), Take by mouth., Disp: , Rfl:    ZIOPTAN 0.0015 % SOLN, Apply 1 drop to eye at bedtime., Disp: , Rfl:    Vitamin D, Ergocalciferol, (DRISDOL) 50000 UNITS CAPS capsule, Take 50,000 Units by mouth every 7 (seven) days.  (Patient not taking: Reported on 04/06/2022), Disp: , Rfl:  4  Laboratory examination:   Lab Results  Component Value Date   NA 141 09/11/2018   K 3.5 09/11/2018   CO2 28 09/11/2018   GLUCOSE 97 09/11/2018   BUN 16 09/11/2018   CREATININE 0.83 09/11/2018   CALCIUM 9.2 09/11/2018   GFRNONAA >60 09/11/2018       Latest Ref Rng & Units 09/11/2018    6:32 AM  CMP  Glucose 70 - 99 mg/dL 97   BUN 8 - 23 mg/dL 16   Creatinine 0.44 - 1.00 mg/dL 0.83   Sodium 135 - 145 mmol/L 141   Potassium 3.5 - 5.1 mmol/L 3.5   Chloride 98 - 111 mmol/L 104   CO2 22 - 32 mmol/L 28   Calcium 8.9 - 10.3 mg/dL 9.2   Total Protein 6.5 - 8.1 g/dL 7.3   Total Bilirubin 0.3 - 1.2 mg/dL 1.5   Alkaline Phos 38 - 126 U/L 55   AST 15 - 41 U/L 23   ALT 0 - 44 U/L 20       Latest Ref Rng & Units 09/11/2018    6:32 AM  CBC  WBC 4.0 - 10.5 K/uL 5.1   Hemoglobin 12.0 - 15.0 g/dL 13.6   Hematocrit 36.0 - 46.0 % 42.8   Platelets 150 - 400 K/uL 229     Lipid Panel No results for input(s): "CHOL", "TRIG", "LDLCALC", "VLDL", "HDL", "CHOLHDL", "LDLDIRECT" in the last 8760 hours.  HEMOGLOBIN A1C No results found for: "HGBA1C", "MPG" TSH No results for input(s): "TSH" in the last 8760 hours.  External labs:     Radiology:    Cardiac Studies:   Echocardiogram 09/06/2022:  Normal LV systolic function with visual EF 55-60%. Left ventricle cavity  is normal in size. Mild concentric hypertrophy of the left ventricle.  Normal global wall motion. Doppler evidence of grade I (impaired)  diastolic dysfunction, normal LAP. Calculated EF 73%.  Structurally normal trileaflet aortic valve.  Moderate (Grade III) aortic  regurgitation.  Structurally normal mitral valve.  Mild (Grade I) mitral regurgitation.  Structurally normal tricuspid valve with trace regurgitation. No evidence  of pulmonary hypertension.  The aortic root is normal. Mildly dilated ascending aorta at 3.6 cm.  No significant change compared to 02/2022 but now ascending aorta is mildly  dilated     Echocardiogram 03/05/2022:  Normal LV systolic function with visual EF 55-60%. Left ventricle cavity  is normal in size. Moderate concentric remodeling of the left ventricle.  Normal global wall motion. Doppler evidence of grade I (impaired)  diastolic dysfunction, normal LAP. Calculated EF 58%.  Trileaflet aortic  valve.  Moderate (Grade III) aortic regurgitation. Mild  aortic valve leaflet thickening.  Structurally normal tricuspid valve.  Mild tricuspid regurgitation.  no prior available for comparison.  Repeat echo annually or every 6 months depending on symptoms    EKG:   02/26/22 NSR with LAE  Assessment     ICD-10-CM   1. Nonrheumatic aortic valve insufficiency  I35.1 EKG 12-Lead    PCV ECHOCARDIOGRAM COMPLETE    2. Essential hypertension  I10 PCV ECHOCARDIOGRAM COMPLETE       Orders Placed This Encounter  Procedures   EKG 12-Lead   PCV ECHOCARDIOGRAM COMPLETE    Standing Status:   Future    Standing Expiration Date:   09/22/2023     Meds ordered this encounter  Medications   amLODipine (NORVASC) 2.5 MG tablet    Sig: Take 1 tablet (2.5 mg total) by mouth daily.    Dispense:  180 tablet    Refill:  3    Medications Discontinued During This Encounter  Medication Reason   amLODipine (NORVASC) 2.5 MG tablet Patient Preference      Recommendations:   Kerry White  is a 77 y.o. female with past medical history significant for hypertension, hyperlipidemia, and osteoporosis who is here for a follow-up visit. Pt presents today with high blood pressure, but otherwise feels fine. She was enrolled in [employer] (now graduated), and during that time was having better blood pressure (reviewed below). She admits to not taking her prescribed amlodipine, and states she does not want to take any medication. She denies any other symptoms at this tim   Hypertension Encourage pt to take amlodipine 2.'5mg'$  for HTN Home pressures are controlled, [employer] data below Encourage  low-sodium diet, less than 2000 mg daily. Follow-up in 6-12 months or sooner if needed.  [employer] DATA: No longer enrolled in [employer] - graduated in Jan 2024 [employer] December 2023 data Average Systolic BP Level 123456 mmHg Lowest Systolic BP Level 123XX123 mmHg Highest Systolic BP Level 123456 mmHg  Nonrheumatic aortic valve insufficiency Echo unchanged Patient remains asymptomatic Repeat echocardiogram in 1 year     Floydene Flock, DO, [facility]  09/22/2022, 11:59 AM Office: [phone] Pager: [phone]

## 2022-09-29 ENCOUNTER — Ambulatory Visit: Payer: BLUE CROSS/BLUE SHIELD | Admitting: Internal Medicine

## 2022-12-23 DIAGNOSIS — H401131 Primary open-angle glaucoma, bilateral, mild stage: Secondary | ICD-10-CM | POA: Diagnosis not present

## 2022-12-23 DIAGNOSIS — H524 Presbyopia: Secondary | ICD-10-CM | POA: Diagnosis not present

## 2023-02-07 DIAGNOSIS — Z1212 Encounter for screening for malignant neoplasm of rectum: Secondary | ICD-10-CM | POA: Diagnosis not present

## 2023-02-07 DIAGNOSIS — E559 Vitamin D deficiency, unspecified: Secondary | ICD-10-CM | POA: Diagnosis not present

## 2023-02-07 DIAGNOSIS — R7989 Other specified abnormal findings of blood chemistry: Secondary | ICD-10-CM | POA: Diagnosis not present

## 2023-02-07 DIAGNOSIS — I10 Essential (primary) hypertension: Secondary | ICD-10-CM | POA: Diagnosis not present

## 2023-02-07 DIAGNOSIS — E785 Hyperlipidemia, unspecified: Secondary | ICD-10-CM | POA: Diagnosis not present

## 2023-02-14 DIAGNOSIS — E559 Vitamin D deficiency, unspecified: Secondary | ICD-10-CM | POA: Diagnosis not present

## 2023-02-14 DIAGNOSIS — M419 Scoliosis, unspecified: Secondary | ICD-10-CM | POA: Diagnosis not present

## 2023-02-14 DIAGNOSIS — Z0000 Encounter for general adult medical examination without abnormal findings: Secondary | ICD-10-CM | POA: Diagnosis not present

## 2023-02-14 DIAGNOSIS — I10 Essential (primary) hypertension: Secondary | ICD-10-CM | POA: Diagnosis not present

## 2023-02-14 DIAGNOSIS — Z1331 Encounter for screening for depression: Secondary | ICD-10-CM | POA: Diagnosis not present

## 2023-02-14 DIAGNOSIS — I351 Nonrheumatic aortic (valve) insufficiency: Secondary | ICD-10-CM | POA: Diagnosis not present

## 2023-02-14 DIAGNOSIS — R82998 Other abnormal findings in urine: Secondary | ICD-10-CM | POA: Diagnosis not present

## 2023-02-14 DIAGNOSIS — E785 Hyperlipidemia, unspecified: Secondary | ICD-10-CM | POA: Diagnosis not present

## 2023-02-14 DIAGNOSIS — Z1339 Encounter for screening examination for other mental health and behavioral disorders: Secondary | ICD-10-CM | POA: Diagnosis not present

## 2023-03-10 ENCOUNTER — Ambulatory Visit: Payer: Medicare Other | Admitting: Cardiology

## 2023-03-10 ENCOUNTER — Encounter: Payer: Self-pay | Admitting: Cardiology

## 2023-03-10 VITALS — BP 179/84 | HR 76 | Resp 16 | Ht 61.0 in | Wt 123.0 lb

## 2023-03-10 DIAGNOSIS — I351 Nonrheumatic aortic (valve) insufficiency: Secondary | ICD-10-CM

## 2023-03-10 DIAGNOSIS — E782 Mixed hyperlipidemia: Secondary | ICD-10-CM

## 2023-03-10 DIAGNOSIS — I10 Essential (primary) hypertension: Secondary | ICD-10-CM | POA: Diagnosis not present

## 2023-03-10 MED ORDER — LOSARTAN POTASSIUM 25 MG PO TABS: 25.0000 mg | ORAL_TABLET | Freq: Every day | ORAL | 3 refills | Status: DC

## 2023-03-10 MED ORDER — ROSUVASTATIN CALCIUM 5 MG PO TABS: 5.0000 mg | ORAL_TABLET | Freq: Every day | ORAL | 3 refills | Status: DC

## 2023-03-10 NOTE — Progress Notes (Signed)
ID:  Kerry White, DOB 03/18/1946, MRN [id number]  PCP:  Garlan Fillers, MD  Cardiologist:  Tessa Lerner, DO, [facility] (established care 03/10/23) Former Cardiology Providers: Dr. Clotilde Dieter  Date: 03/10/23  Chief Complaint  Patient presents with   Nonrheumatic aortic valve insufficiency   Results    Calcium results    HPI  Kerry White is a 77 y.o. Caucasian female whose past medical history and cardiovascular risk factors include: Minimal CAC, hypertension, hyperlipidemia, aortic valve disease.  She was formally under the care of Dr. Rozell Searing Custovic labs in the first time at today's office visit.  Patient was being followed by the practice for hypertension and aortic regurgitation according to EMR.  Patient is accompanied by her husband at today's office visit.  She recently had a brother who passed away in his sleep at the age of 54 and since then has had a coronary calcium score and outside facility records available in Care Everywhere.  She has minimal CAC and aortic root calcification.  She followed up with her PCP for yearly physical and had labs done which were independently reviewed and noted below for further reference.  She does have a history of hypertension but states " I do not do well with medications.".  Currently taking half a tablet of amlodipine 2.5 mg daily.  Patient states at home blood pressures are usually around 120-130 mmHg.  She attributes her current readings to whitecoat hypertension.  FUNCTIONAL STATUS: Walks her dog 3 times a week about 1.5 miles each.    ALLERGIES: Allergies  Allergen Reactions   Clindamycin/Lincomycin Diarrhea   Ibuprofen     cough   Macrobid [Nitrofurantoin Macrocrystal] Nausea Only and Other (See Comments)    Headache, dizziness   Penicillins    Sulfa Antibiotics     MEDICATION LIST PRIOR TO VISIT: Current Meds  Medication Sig   losartan (COZAAR) 25 MG tablet Take 1 tablet (25 mg total) by mouth daily.    multivitamin-lutein (OCUVITE-LUTEIN) CAPS capsule Take 1 capsule by mouth daily.   Probiotic Product (PROBIOTIC-10 PO) Take by mouth.   rosuvastatin (CRESTOR) 5 MG tablet Take 1 tablet (5 mg total) by mouth daily.   ZIOPTAN 0.0015 % SOLN Apply 1 drop to eye at bedtime.   [DISCONTINUED] amLODipine (NORVASC) 2.5 MG tablet Take 1 tablet (2.5 mg total) by mouth daily. (Patient taking differently: Take 0.5 mg by mouth daily.)     PAST MEDICAL HISTORY: Past Medical History:  Diagnosis Date   Abnormal uterine bleeding 1998   History of Clostridium difficile infection    Loss of smell    Loss of taste    Osteoporosis    Scoliosis     PAST SURGICAL HISTORY: Past Surgical History:  Procedure Laterality Date   DILATATION & CURETTAGE/HYSTEROSCOPY WITH MYOSURE N/A 09/11/2018   Procedure: DILATATION & CURETTAGE/HYSTEROSCOPY WITH MYOSURE;  Surgeon: Romualdo Bolk, MD;  Location: [facility];  Service: Gynecology;  Laterality: N/A;   NASAL SINUS SURGERY  2013    FAMILY HISTORY: The patient family history includes Alzheimer's disease in her mother; Glaucoma in her brother; Heart disease in her brother; Heart failure in her father; Hyperlipidemia in her father.  SOCIAL HISTORY:  The patient  reports that she has never smoked. She has never used smokeless tobacco. She reports current alcohol use of about 3.0 - 4.0 standard drinks of alcohol per week. She reports that she does not use drugs.  REVIEW OF SYSTEMS: Review of Systems  Cardiovascular:  Negative for chest pain, claudication, dyspnea on exertion, irregular heartbeat, leg swelling, near-syncope, orthopnea, palpitations, paroxysmal nocturnal dyspnea and syncope.  Respiratory:  Negative for shortness of breath.   Hematologic/Lymphatic: Negative for bleeding problem.  Musculoskeletal:  Negative for muscle cramps and myalgias.  Neurological:  Negative for dizziness and light-headedness.    PHYSICAL EXAM:    03/10/2023     9:20 AM 03/10/2023    9:17 AM 09/22/2022   11:26 AM  Vitals with BMI  Height  5\' 1"    Weight  123 lbs   BMI  23.25   Systolic  179 181  Diastolic  84 82  Pulse 76 78 75    Physical Exam  Constitutional: No distress.  Age appropriate, hemodynamically stable.   Neck: No JVD present.  Cardiovascular: Normal rate, regular rhythm, S1 normal, S2 normal, intact distal pulses and normal pulses. Exam reveals no gallop, no S3 and no S4.  Murmur heard. Decrescendo early diastolic murmur is present with a grade of 3/4 at the upper right sternal border radiating to the apex. Pulmonary/Chest: Effort normal and breath sounds normal. No stridor. She has no wheezes. She has no rales.  Scoliosis of the spine.  Abdominal: Soft. Bowel sounds are normal. She exhibits no distension. There is no abdominal tenderness.  Musculoskeletal:        General: No edema.     Cervical back: Neck supple.  Neurological: She is alert and oriented to person, place, and time. She has intact cranial nerves (2-12).  Skin: Skin is warm and moist.     CARDIAC DATABASE: EKG: 02/26/2022: Sinus rhythm, 73 bpm, LAE, without underlying injury pattern.  Echocardiogram: 09/06/2022: LVEF 55 to 60%, grade 1 diastolic dysfunction, moderate AI, mild MR, ascending aorta 36 mm  CORONARY CALCIUM Score:  Left main coronary artery: 0  Left anterior descending: 3  Circumflex: 0  Right coronary artery: 0  Total Agatston score: 3  MESA database percentile: 28  Aortic root calcifications.   LABORATORY DATA:    Latest Ref Rng & Units 09/11/2018    6:32 AM  CBC  WBC 4.0 - 10.5 K/uL 5.1   Hemoglobin 12.0 - 15.0 g/dL 34.7   Hematocrit 42.5 - 46.0 % 42.8   Platelets 150 - 400 K/uL 229        Latest Ref Rng & Units 09/11/2018    6:32 AM  CMP  Glucose 70 - 99 mg/dL 97   BUN 8 - 23 mg/dL 16   Creatinine 9.56 - 1.00 mg/dL 3.87   Sodium 564 - 332 mmol/L 141   Potassium 3.5 - 5.1 mmol/L 3.5   Chloride 98 - 111 mmol/L 104   CO2 22  - 32 mmol/L 28   Calcium 8.9 - 10.3 mg/dL 9.2   Total Protein 6.5 - 8.1 g/dL 7.3   Total Bilirubin 0.3 - 1.2 mg/dL 1.5   Alkaline Phos 38 - 126 U/L 55   AST 15 - 41 U/L 23   ALT 0 - 44 U/L 20     No results found for: "CHOL", "HDL", "LDLCALC", "LDLDIRECT", "TRIG", "CHOLHDL" No components found for: "NTPROBNP" No results for input(s): "PROBNP" in the last 8760 hours. No results for input(s): "TSH" in the last 8760 hours.  BMP No results for input(s): "NA", "K", "CL", "CO2", "GLUCOSE", "BUN", "CREATININE", "CALCIUM", "GFRNONAA", "GFRAA" in the last 8760 hours.  HEMOGLOBIN A1C No results found for: "HGBA1C", "MPG"  IMPRESSION:    ICD-10-CM   1. Benign hypertension  I10 losartan (COZAAR) 25 MG tablet    2. Nonrheumatic aortic valve insufficiency  I35.1 losartan (COZAAR) 25 MG tablet    3. Mixed hyperlipidemia  E78.2 rosuvastatin (CRESTOR) 5 MG tablet    Lipid Panel With LDL/HDL Ratio    LDL cholesterol, direct    CMP14+EGFR    4. Aortic valve disorder  I35.9        RECOMMENDATIONS: Itta Schechter is a 77 y.o. Caucasian female whose past medical history and cardiac risk factors include: Minimal CAC, hypertension, hyperlipidemia, aortic valve disease.  Benign hypertension Office blood pressures are not at goal. Home blood pressures could also be better controlled. I have asked her to start checking her blood pressures to see what her ambulatory trend is. Reemphasized importance of a low-salt diet. Discontinue amlodipine 2.5 mg half tablet daily. Start losartan 25 mg p.o. daily. Labs in 1 week to evaluate kidney function and electrolytes.  Nonrheumatic aortic valve insufficiency Based on prior echocardiograms noted to have moderate aortic regurgitation. Given the valvular heart disease and blood pressures not being well-controlled reemphasized importance of blood pressure management. Patient is agreeable. Medication changes as noted above. Follow-up echo scheduled for  March 2025.  Mixed hyperlipidemia Patient is noted to have mild CAC. LDL levels are not well-controlled. Continue risk of ASCVD is greater than 7.5%. Shared decision was to start low-dose statin therapy-Crestor 5 mg p.o. nightly. Labs in 6 weeks to reevaluate therapy.   FINAL MEDICATION LIST END OF ENCOUNTER: Meds ordered this encounter  Medications   losartan (COZAAR) 25 MG tablet    Sig: Take 1 tablet (25 mg total) by mouth daily.    Dispense:  90 tablet    Refill:  3   rosuvastatin (CRESTOR) 5 MG tablet    Sig: Take 1 tablet (5 mg total) by mouth daily.    Dispense:  90 tablet    Refill:  3    Medications Discontinued During This Encounter  Medication Reason   Vitamin D, Ergocalciferol, (DRISDOL) 50000 UNITS CAPS capsule    amLODipine (NORVASC) 2.5 MG tablet      Current Outpatient Medications:    losartan (COZAAR) 25 MG tablet, Take 1 tablet (25 mg total) by mouth daily., Disp: 90 tablet, Rfl: 3   multivitamin-lutein (OCUVITE-LUTEIN) CAPS capsule, Take 1 capsule by mouth daily., Disp: , Rfl:    Probiotic Product (PROBIOTIC-10 PO), Take by mouth., Disp: , Rfl:    rosuvastatin (CRESTOR) 5 MG tablet, Take 1 tablet (5 mg total) by mouth daily., Disp: 90 tablet, Rfl: 3   ZIOPTAN 0.0015 % SOLN, Apply 1 drop to eye at bedtime., Disp: , Rfl:   Orders Placed This Encounter  Procedures   Lipid Panel With LDL/HDL Ratio   LDL cholesterol, direct   [id number]    There are no Patient Instructions on file for this visit.   --Continue cardiac medications as reconciled in final medication list. --Return in about 6 months (around 09/19/2023). or sooner if needed. --Continue follow-up with your primary care physician regarding the management of your other chronic comorbid conditions.  Patient's questions and concerns were addressed to her satisfaction. She voices understanding of the instructions provided during this encounter.   This note was created using a voice recognition  software as a result there may be grammatical errors inadvertently enclosed that do not reflect the nature of this encounter. Every attempt is made to correct such errors.  Tessa Lerner, [place], [facility]  Pager:  [phone] Office: [phone]

## 2023-04-23 DIAGNOSIS — Z23 Encounter for immunization: Secondary | ICD-10-CM | POA: Diagnosis not present

## 2023-04-25 DIAGNOSIS — H401131 Primary open-angle glaucoma, bilateral, mild stage: Secondary | ICD-10-CM | POA: Diagnosis not present

## 2023-04-25 DIAGNOSIS — H2513 Age-related nuclear cataract, bilateral: Secondary | ICD-10-CM | POA: Diagnosis not present

## 2023-08-18 ENCOUNTER — Telehealth: Payer: Self-pay | Admitting: Cardiology

## 2023-08-18 ENCOUNTER — Other Ambulatory Visit: Payer: Self-pay

## 2023-08-18 DIAGNOSIS — E782 Mixed hyperlipidemia: Secondary | ICD-10-CM

## 2023-08-18 NOTE — Telephone Encounter (Signed)
 Patient states [facility] would not accept her written orders for lab work and they need to be faxed directly to them. Patient would like a call back to confirm.

## 2023-08-18 NOTE — Telephone Encounter (Signed)
 Spoke with pt and released the current labs that were in order review. Explained to pt that if [facility] still can't see the orders when she goes back to call our office while she is still there and we can place new orders so she doesn't have to leave and come back again. Pt verbalized understanding and had no further questions.

## 2023-08-24 DIAGNOSIS — E782 Mixed hyperlipidemia: Secondary | ICD-10-CM | POA: Diagnosis not present

## 2023-08-25 LAB — CMP14+EGFR
ALT: 23 [IU]/L (ref 0–32)
AST: 25 [IU]/L (ref 0–40)
Albumin: 4.5 g/dL (ref 3.8–4.8)
Alkaline Phosphatase: 57 [IU]/L (ref 44–121)
BUN/Creatinine Ratio: 22 (ref 12–28)
BUN: 20 mg/dL (ref 8–27)
Bilirubin Total: 0.9 mg/dL (ref 0.0–1.2)
CO2: 24 mmol/L (ref 20–29)
Calcium: 9.4 mg/dL (ref 8.7–10.3)
Chloride: 105 mmol/L (ref 96–106)
Creatinine, Ser: 0.89 mg/dL (ref 0.57–1.00)
Globulin, Total: 2 g/dL (ref 1.5–4.5)
Glucose: 90 mg/dL (ref 70–99)
Potassium: 4.4 mmol/L (ref 3.5–5.2)
Sodium: 143 mmol/L (ref 134–144)
Total Protein: 6.5 g/dL (ref 6.0–8.5)
eGFR: 67 mL/min/{1.73_m2} (ref >59)

## 2023-08-25 LAB — LIPID PANEL WITH LDL/HDL RATIO
Cholesterol, Total: 171 mg/dL (ref 100–199)
HDL: 92 mg/dL (ref >39)
LDL Chol Calc (NIH): 67 mg/dL (ref 0–99)
LDL/HDL Ratio: 0.7 {ratio} (ref 0.0–3.2)
Triglycerides: 63 mg/dL (ref 0–149)
VLDL Cholesterol Cal: 12 mg/dL (ref 5–40)

## 2023-08-25 LAB — LDL CHOLESTEROL, DIRECT: LDL Direct: 73 mg/dL (ref 0–99)

## 2023-08-26 DIAGNOSIS — H2513 Age-related nuclear cataract, bilateral: Secondary | ICD-10-CM | POA: Diagnosis not present

## 2023-08-26 DIAGNOSIS — H401131 Primary open-angle glaucoma, bilateral, mild stage: Secondary | ICD-10-CM | POA: Diagnosis not present

## 2023-09-03 ENCOUNTER — Encounter: Payer: Self-pay | Admitting: Cardiology

## 2023-09-19 ENCOUNTER — Ambulatory Visit (HOSPITAL_COMMUNITY): Payer: Medicare Other | Attending: Internal Medicine

## 2023-09-19 ENCOUNTER — Other Ambulatory Visit: Payer: BLUE CROSS/BLUE SHIELD

## 2023-09-19 DIAGNOSIS — I351 Nonrheumatic aortic (valve) insufficiency: Secondary | ICD-10-CM | POA: Diagnosis not present

## 2023-09-19 DIAGNOSIS — I10 Essential (primary) hypertension: Secondary | ICD-10-CM | POA: Insufficient documentation

## 2023-09-19 LAB — ECHOCARDIOGRAM COMPLETE
AR max vel: 2.61 cm2
AV Area VTI: 2.79 cm2
AV Area mean vel: 2.63 cm2
AV Mean grad: 6.8 mmHg
AV Peak grad: 11.9 mmHg
Ao pk vel: 1.72 m/s
Area-P 1/2: 3.21 cm2
P 1/2 time: 399 ms
S' Lateral: 1.9 cm

## 2023-09-26 ENCOUNTER — Encounter: Payer: Self-pay | Admitting: Cardiology

## 2023-09-26 ENCOUNTER — Ambulatory Visit: Payer: Medicare Other | Attending: Internal Medicine | Admitting: Cardiology

## 2023-09-26 VITALS — BP 148/90 | HR 78 | Resp 16 | Ht 61.0 in | Wt 120.0 lb

## 2023-09-26 DIAGNOSIS — E782 Mixed hyperlipidemia: Secondary | ICD-10-CM | POA: Diagnosis not present

## 2023-09-26 DIAGNOSIS — I351 Nonrheumatic aortic (valve) insufficiency: Secondary | ICD-10-CM | POA: Diagnosis not present

## 2023-09-26 DIAGNOSIS — I10 Essential (primary) hypertension: Secondary | ICD-10-CM | POA: Diagnosis not present

## 2023-09-26 MED ORDER — LOSARTAN POTASSIUM 50 MG PO TABS: 50.0000 mg | ORAL_TABLET | Freq: Every day | ORAL | 3 refills | Status: DC

## 2023-09-26 NOTE — Patient Instructions (Signed)
 Medication Instructions:  Your physician has recommended you make the following change in your medication:   INCREASE Losartan to 50 mg once daily    *If you need a refill on your cardiac medications before your next appointment, please call your pharmacy*  Lab Work: To be completed in 1 week: BMP  If you have labs (blood work) drawn today and your tests are completely normal, you will receive your results only by: MyChart Message (if you have MyChart) OR A paper copy in the mail If you have any lab test that is abnormal or we need to change your treatment, we will call you to review the results.  Testing/Procedures: None ordered today.  Follow-Up: At [facility], you and your health needs are our priority.  As part of our continuing mission to provide you with exceptional heart care, we have created designated Provider Care Teams.  These Care Teams include your primary Cardiologist (physician) and Advanced Practice Providers (APPs -  Physician Assistants and Nurse Practitioners) who all work together to provide you with the care you need, when you need it.   Your next appointment:   1 year(s)  The format for your next appointment:   In Person  Provider:   Tessa Lerner, DO {

## 2023-09-26 NOTE — Progress Notes (Signed)
 Cardiology Office Note:  .   Date:  09/26/2023  ID:  Kerry White, DOB 05-Nov-1945, MRN [id number] PCP:  Garlan Fillers, MD  Former Cardiology Providers: Dr. Clotilde Dieter  [facility] Providers Cardiologist:  Tessa Lerner, DO , [facility] (established care 03/10/2023) Electrophysiologist:  None  Click to update primary MD,subspecialty MD or APP then REFRESH:1}    Chief Complaint  Patient presents with   Follow-up    1 year follow-up-aortic regurgitation/hypertension    History of Present Illness: Kerry White   Kerry White is a 78 y.o. Caucasian female whose past medical history and cardiovascular risk factors includes: Minimal CAC, hypertension, hyperlipidemia, aortic valve disease.   Patient was following being carried by Dr. Clotilde Dieter for management of hypertension and aortic regurgitation.  In the past patient stated that she has not done well with medications and was only on half a tablet of amlodipine 2.5 mg p.o. daily.  At the last office visit patient was willing to transition to losartan 25 mg p.o. daily.  At last office visit given her ASCVD risk score, minimal CAC, and lipid profile shared decision was to start Crestor 5 mg p.o. nightly.  Follow-up labs on 08/24/2023 no direct LDL at 73 mg/dL and triglycerides are 63 mg/dL LFTs are within normal limits.  She presents today for 1 year follow-up visit.  She underwent an echocardiogram to monitor her aortic regurgitation, which remains moderate. Cardiac function is stable with an ejection fraction of 60-65%. No significant symptoms such as fatigue or shortness of breath are present.  Blood pressure readings at home fluctuate, with an average range between 130 to 140 mmHg systolic. Previously, readings were as high as 140 to 150 mmHg, but they have improved since starting medication. She is currently taking losartan 25 mg once daily.  Overall functional capacity remains stable.  Review of Systems: .   Review of  Systems  Cardiovascular:  Negative for chest pain, claudication, irregular heartbeat, leg swelling, near-syncope, orthopnea, palpitations, paroxysmal nocturnal dyspnea and syncope.  Respiratory:  Negative for shortness of breath.   Hematologic/Lymphatic: Negative for bleeding problem.    Studies Reviewed:   EKG: EKG Interpretation Date/Time:  Monday September 26 2023 10:23:24 EDT Ventricular Rate:  72 PR Interval:  184 QRS Duration:  102 QT Interval:  408 QTC Calculation: 446 R Axis:   -33  Text Interpretation: Normal sinus rhythm Possible Left atrial enlargement Left axis deviation Incomplete right bundle branch block Left ventricular hypertrophy ( R in aVL , Cornell product ) Cannot rule out Septal infarct , age undetermined No previous ECGs available Confirmed by Tessa Lerner [phone]) on 09/26/2023 10:42:28 AM  Echocardiogram: 09/06/2022: LVEF 55 to 60%, grade 1 diastolic dysfunction, moderate AI, mild MR, ascending aorta 36 mm  09/19/2023  1. Left ventricular ejection fraction, by estimation, is 65 to 70%. The  left ventricle has normal function. The left ventricle has no regional  wall motion abnormalities. Left ventricular diastolic parameters were  normal. The average left ventricular  global longitudinal strain is -25.6 %. The global longitudinal strain is  normal.   2. Right ventricular systolic function is normal. The right ventricular  size is normal.   3. Mild mitral valve regurgitation.   4. Tricuspid valve regurgitation is mild to moderate.   5. Pressure half time is 410 msec. The aortic valve is tricuspid. Aortic  valve regurgitation is moderate. Aortic valve sclerosis/calcification is  present, without any evidence of aortic stenosis.   6. The inferior vena cava  is normal in size with greater than 50%  respiratory variability, suggesting right atrial pressure of 3 mmHg.    CORONARY CALCIUM Score:  Left main coronary artery: 0  Left anterior descending: 3   Circumflex: 0  Right coronary artery: 0  Total Agatston score: 3  MESA database percentile: 28  Aortic root calcifications.  RADIOLOGY: NA  Risk Assessment/Calculations:   NA   Labs:       Latest Ref Rng & Units 09/11/2018    6:32 AM  CBC  WBC 4.0 - 10.5 K/uL 5.1   Hemoglobin 12.0 - 15.0 g/dL 46.9   Hematocrit 62.9 - 46.0 % 42.8   Platelets 150 - 400 K/uL 229        Latest Ref Rng & Units 08/24/2023    8:54 AM 09/11/2018    6:32 AM  BMP  Glucose 70 - 99 mg/dL 90  97   BUN 8 - 27 mg/dL 20  16   Creatinine 5.28 - 1.00 mg/dL 4.13  2.44   BUN/Creat Ratio 12 - 28 22    Sodium 134 - 144 mmol/L 143  141   Potassium 3.5 - 5.2 mmol/L 4.4  3.5   Chloride 96 - 106 mmol/L 105  104   CO2 20 - 29 mmol/L 24  28   Calcium 8.7 - 10.3 mg/dL 9.4  9.2       Latest Ref Rng & Units 08/24/2023    8:54 AM 09/11/2018    6:32 AM  CMP  Glucose 70 - 99 mg/dL 90  97   BUN 8 - 27 mg/dL 20  16   Creatinine 0.10 - 1.00 mg/dL 2.72  5.36   Sodium 644 - 144 mmol/L 143  141   Potassium 3.5 - 5.2 mmol/L 4.4  3.5   Chloride 96 - 106 mmol/L 105  104   CO2 20 - 29 mmol/L 24  28   Calcium 8.7 - 10.3 mg/dL 9.4  9.2   Total Protein 6.0 - 8.5 g/dL 6.5  7.3   Total Bilirubin 0.0 - 1.2 mg/dL 0.9  1.5   Alkaline Phos 44 - 121 IU/L 57  55   AST 0 - 40 IU/L 25  23   ALT 0 - 32 IU/L 23  20     Lab Results  Component Value Date   CHOL 171 08/24/2023   HDL 92 08/24/2023   LDLCALC 67 08/24/2023   LDLDIRECT 73 08/24/2023   TRIG 63 08/24/2023   No results for input(s): "LIPOA" in the last 8760 hours. No components found for: "NTPROBNP" No results for input(s): "PROBNP" in the last 8760 hours. No results for input(s): "TSH" in the last 8760 hours.  Physical Exam:    Today's Vitals   09/26/23 1019  BP: (!) 148/90  Pulse: 78  Resp: 16  SpO2: 98%  Weight: 120 lb (54.4 kg)  Height: 5\' 1"  (1.549 m)   Body mass index is 22.67 kg/m. Wt Readings from Last 3 Encounters:  09/26/23 120 lb (54.4 kg)   03/10/23 123 lb (55.8 kg)  09/22/22 123 lb 12.8 oz (56.2 kg)    Physical Exam  Constitutional: No distress.  Age appropriate, hemodynamically stable.   Neck: No JVD present.  Cardiovascular: Normal rate, regular rhythm, S1 normal, S2 normal, intact distal pulses and normal pulses. Exam reveals no gallop, no S3 and no S4.  Murmur heard. Decrescendo early diastolic murmur is present with a grade of 3/4 at the upper right sternal border  radiating to the apex. Pulmonary/Chest: Effort normal and breath sounds normal. No stridor. She has no wheezes. She has no rales.  Scoliosis of the spine.  Abdominal: Soft. Bowel sounds are normal. She exhibits no distension. There is no abdominal tenderness.  Musculoskeletal:        General: No edema.     Cervical back: Neck supple.  Neurological: She is alert and oriented to person, place, and time. She has intact cranial nerves (2-12).  Skin: Skin is warm and moist.     Impression & Recommendation(s):  Impression:   ICD-10-CM   1. Benign hypertension  I10 EKG 12-Lead    Basic metabolic panel    losartan (COZAAR) 50 MG tablet    Basic metabolic panel    2. Nonrheumatic aortic valve insufficiency  I35.1 losartan (COZAAR) 50 MG tablet    3. Mixed hyperlipidemia  E78.2        Recommendation(s):  Benign hypertension Office blood pressures are acceptable but not at goal. Home blood pressures could also improve, better compared to last office visit. Increase losartan from 25 mg p.o. daily to 50 mg p.o. daily. Recommend home SBP around 130 mmHg. BMP in one week to check renal function and electrolytes  Nonrheumatic aortic valve insufficiency Chronic and stable. Repeat echocardiogram notes moderate aortic regurgitation. Clinically asymptomatic. LVEF is preserved. Recommend better blood pressure management. Will avoid beta-blocker therapy if possible. Increase losartan as discussed above Recommend repeating echocardiogram in 2 years to  reevaluate disease progression  Mixed hyperlipidemia Currently on Crestor 5 mg p.o. daily.   She denies myalgia or other side effects. Most recent lipids dated 08/24/2023, independently reviewed as noted above.  LDL is 73 mg/dL and triglycerides are 63 mg/dL.  Significantly improved when compared to prior lipid profile. Continue current medical therapy  Orders Placed:  Orders Placed This Encounter  Procedures   Basic metabolic panel    Standing Status:   Future    Number of Occurrences:   1    Expected Date:   10/03/2023    Expiration Date:   09/25/2024   EKG 12-Lead     Final Medication List:    Meds ordered this encounter  Medications   losartan (COZAAR) 50 MG tablet    Sig: Take 1 tablet (50 mg total) by mouth daily.    Dispense:  90 tablet    Refill:  3    Increased to 50 mg    Medications Discontinued During This Encounter  Medication Reason   losartan (COZAAR) 25 MG tablet      Current Outpatient Medications:    multivitamin-lutein (OCUVITE-LUTEIN) CAPS capsule, Take 1 capsule by mouth daily., Disp: , Rfl:    Probiotic Product (PROBIOTIC-10 PO), Take by mouth., Disp: , Rfl:    rosuvastatin (CRESTOR) 5 MG tablet, Take 1 tablet (5 mg total) by mouth daily., Disp: 90 tablet, Rfl: 3   ZIOPTAN 0.0015 % SOLN, Apply 1 drop to eye at bedtime., Disp: , Rfl:    losartan (COZAAR) 50 MG tablet, Take 1 tablet (50 mg total) by mouth daily., Disp: 90 tablet, Rfl: 3  Consent:   NA  Disposition:   1 year sooner if needed  Her questions and concerns were addressed to her satisfaction. She voices understanding of the recommendations provided during this encounter.    Signed, Tessa Lerner, DO, [facility] [place]  [facility]  [address] [place], [place] [address] 09/26/2023 7:01 PM

## 2023-09-28 ENCOUNTER — Ambulatory Visit: Payer: BLUE CROSS/BLUE SHIELD | Admitting: Internal Medicine

## 2023-10-10 DIAGNOSIS — I10 Essential (primary) hypertension: Secondary | ICD-10-CM | POA: Diagnosis not present

## 2023-10-11 LAB — BASIC METABOLIC PANEL WITH GFR
BUN/Creatinine Ratio: 19 (ref 12–28)
BUN: 17 mg/dL (ref 8–27)
CO2: 24 mmol/L (ref 20–29)
Calcium: 9.5 mg/dL (ref 8.7–10.3)
Chloride: 105 mmol/L (ref 96–106)
Creatinine, Ser: 0.89 mg/dL (ref 0.57–1.00)
Glucose: 86 mg/dL (ref 70–99)
Potassium: 4.4 mmol/L (ref 3.5–5.2)
Sodium: 143 mmol/L (ref 134–144)
eGFR: 67 mL/min/{1.73_m2} (ref >59)

## 2023-10-12 ENCOUNTER — Encounter: Payer: Self-pay | Admitting: Cardiology

## 2023-12-27 DIAGNOSIS — H401131 Primary open-angle glaucoma, bilateral, mild stage: Secondary | ICD-10-CM | POA: Diagnosis not present

## 2024-01-27 DIAGNOSIS — H2513 Age-related nuclear cataract, bilateral: Secondary | ICD-10-CM | POA: Diagnosis not present

## 2024-01-27 DIAGNOSIS — H401131 Primary open-angle glaucoma, bilateral, mild stage: Secondary | ICD-10-CM | POA: Diagnosis not present

## 2024-02-09 DIAGNOSIS — I10 Essential (primary) hypertension: Secondary | ICD-10-CM | POA: Diagnosis not present

## 2024-02-09 DIAGNOSIS — E7849 Other hyperlipidemia: Secondary | ICD-10-CM | POA: Diagnosis not present

## 2024-02-09 DIAGNOSIS — E785 Hyperlipidemia, unspecified: Secondary | ICD-10-CM | POA: Diagnosis not present

## 2024-02-09 DIAGNOSIS — E559 Vitamin D deficiency, unspecified: Secondary | ICD-10-CM | POA: Diagnosis not present

## 2024-02-09 DIAGNOSIS — Z1212 Encounter for screening for malignant neoplasm of rectum: Secondary | ICD-10-CM | POA: Diagnosis not present

## 2024-02-16 DIAGNOSIS — E785 Hyperlipidemia, unspecified: Secondary | ICD-10-CM | POA: Diagnosis not present

## 2024-02-16 DIAGNOSIS — Z1331 Encounter for screening for depression: Secondary | ICD-10-CM | POA: Diagnosis not present

## 2024-02-16 DIAGNOSIS — Z0000 Encounter for general adult medical examination without abnormal findings: Secondary | ICD-10-CM | POA: Diagnosis not present

## 2024-02-16 DIAGNOSIS — I10 Essential (primary) hypertension: Secondary | ICD-10-CM | POA: Diagnosis not present

## 2024-02-16 DIAGNOSIS — R82998 Other abnormal findings in urine: Secondary | ICD-10-CM | POA: Diagnosis not present

## 2024-02-16 DIAGNOSIS — M810 Age-related osteoporosis without current pathological fracture: Secondary | ICD-10-CM | POA: Diagnosis not present

## 2024-02-16 DIAGNOSIS — I351 Nonrheumatic aortic (valve) insufficiency: Secondary | ICD-10-CM | POA: Diagnosis not present

## 2024-02-16 DIAGNOSIS — E559 Vitamin D deficiency, unspecified: Secondary | ICD-10-CM | POA: Diagnosis not present

## 2024-02-16 DIAGNOSIS — Z1339 Encounter for screening examination for other mental health and behavioral disorders: Secondary | ICD-10-CM | POA: Diagnosis not present

## 2024-02-16 DIAGNOSIS — J45909 Unspecified asthma, uncomplicated: Secondary | ICD-10-CM | POA: Diagnosis not present

## 2024-02-16 DIAGNOSIS — R2681 Unsteadiness on feet: Secondary | ICD-10-CM | POA: Diagnosis not present

## 2024-02-17 ENCOUNTER — Encounter: Payer: Self-pay | Admitting: Cardiology

## 2024-02-17 NOTE — Telephone Encounter (Signed)
 I agree with [facility] regarding knowing what your BP is at home.   However, based on the last office note we discussed up titration of losartan  with a goal of keeping SBP of 130 mmHg. Amlodipine  2.5 mg p.o. daily is acceptable in addition if needed to further improve your blood pressure numbers.  Best regards  Dr. Michele

## 2024-02-20 NOTE — Telephone Encounter (Signed)
 Average BP 135/70 mmHg which is acceptable.  What BP meds was see taking during this recording and what part of the day?  Soyla Bainter [place], DO, FACC

## 2024-02-21 NOTE — Telephone Encounter (Signed)
 Yes, continue losartan  25 mg p.o. daily as this is what she was taking when her average blood pressure was 135/70 mmHg at home.  Dr. Denaja Verhoeven

## 2024-02-25 ENCOUNTER — Other Ambulatory Visit: Payer: Self-pay | Admitting: Cardiology

## 2024-02-25 DIAGNOSIS — I351 Nonrheumatic aortic (valve) insufficiency: Secondary | ICD-10-CM

## 2024-02-25 DIAGNOSIS — I10 Essential (primary) hypertension: Secondary | ICD-10-CM

## 2024-02-25 DIAGNOSIS — E782 Mixed hyperlipidemia: Secondary | ICD-10-CM

## 2024-04-28 DIAGNOSIS — Z23 Encounter for immunization: Secondary | ICD-10-CM | POA: Diagnosis not present

## 2024-05-07 DIAGNOSIS — H401131 Primary open-angle glaucoma, bilateral, mild stage: Secondary | ICD-10-CM | POA: Diagnosis not present

## 2024-05-30 ENCOUNTER — Telehealth: Payer: Self-pay | Admitting: Cardiology

## 2024-05-30 DIAGNOSIS — I351 Nonrheumatic aortic (valve) insufficiency: Secondary | ICD-10-CM

## 2024-05-30 DIAGNOSIS — I10 Essential (primary) hypertension: Secondary | ICD-10-CM

## 2024-06-04 NOTE — Telephone Encounter (Signed)
*  STAT* If patient is at the pharmacy, call can be transferred to refill team.   1. Which medications need to be refilled? (please list name of each medication and dose if known)   losartan  (COZAAR ) 50 MG tablet   2. Would you like to learn more about the convenience, safety, & potential cost savings by using the [facility]?   3. Are you open to using the [facility] (Type [facility]. ).  4. Which pharmacy/location (including street and city if local pharmacy) is medication to be sent to?  [id number] - [place], [place] - [address]   5. Do they need a 30 day or 90 day supply?   Patient stated she still has some medication.  Patient has appointment scheduled with Dr. Michele on 10/04/24.

## 2024-06-05 MED ORDER — LOSARTAN POTASSIUM 25 MG PO TABS: 25.0000 mg | ORAL_TABLET | Freq: Every day | ORAL | 1 refills | Status: AC

## 2024-06-05 NOTE — Addendum Note (Signed)
 Addended by: MEMORY DELON POUR on: 06/05/2024 07:20 AM   Modules accepted: Orders

## 2024-06-05 NOTE — Telephone Encounter (Signed)
 Refill sent.

## 2024-10-04 ENCOUNTER — Ambulatory Visit: Admitting: Cardiology
